# Patient Record
Sex: Female | Born: 1970 | Hispanic: No | Marital: Married | Smoking: Never smoker
Health system: Southern US, Community
[De-identification: ages and names within clinical notes are randomized; demographics above are authoritative.]

---

## 2004-08-31 ENCOUNTER — Other Ambulatory Visit: Admission: RE | Admit: 2004-08-31 | Discharge: 2004-08-31 | Payer: Self-pay | Admitting: Family Medicine

## 2005-08-11 ENCOUNTER — Encounter: Admission: RE | Admit: 2005-08-11 | Discharge: 2005-08-11 | Payer: Self-pay | Admitting: Family Medicine

## 2006-01-30 ENCOUNTER — Encounter: Admission: RE | Admit: 2006-01-30 | Discharge: 2006-01-30 | Payer: Self-pay | Admitting: Family Medicine

## 2006-09-06 ENCOUNTER — Encounter: Admission: RE | Admit: 2006-09-06 | Discharge: 2006-09-06 | Payer: Self-pay | Admitting: Family Medicine

## 2007-03-08 ENCOUNTER — Encounter: Admission: RE | Admit: 2007-03-08 | Discharge: 2007-03-08 | Payer: Self-pay | Admitting: Family Medicine

## 2007-09-30 ENCOUNTER — Encounter: Admission: RE | Admit: 2007-09-30 | Discharge: 2007-09-30 | Payer: Self-pay | Admitting: Family Medicine

## 2014-10-13 ENCOUNTER — Other Ambulatory Visit: Payer: Self-pay

## 2014-10-13 DIAGNOSIS — Z1231 Encounter for screening mammogram for malignant neoplasm of breast: Secondary | ICD-10-CM

## 2014-10-28 ENCOUNTER — Other Ambulatory Visit: Payer: Self-pay

## 2014-10-28 ENCOUNTER — Ambulatory Visit
Admission: RE | Admit: 2014-10-28 | Discharge: 2014-10-28 | Disposition: A | Discharge status: Home / Self Care | Payer: BLUE CROSS/BLUE SHIELD | Source: Ambulatory Visit

## 2014-10-28 DIAGNOSIS — Z1231 Encounter for screening mammogram for malignant neoplasm of breast: Secondary | ICD-10-CM

## 2015-10-08 ENCOUNTER — Encounter: Payer: Self-pay | Admitting: Family Medicine

## 2016-12-25 ENCOUNTER — Other Ambulatory Visit: Payer: Self-pay | Admitting: Internal Medicine

## 2016-12-25 DIAGNOSIS — Z1231 Encounter for screening mammogram for malignant neoplasm of breast: Secondary | ICD-10-CM

## 2017-01-09 ENCOUNTER — Ambulatory Visit (INDEPENDENT_AMBULATORY_CARE_PROVIDER_SITE_OTHER): Payer: 59 | Admitting: Sports Medicine

## 2017-01-09 DIAGNOSIS — M25571 Pain in right ankle and joints of right foot: Secondary | ICD-10-CM

## 2017-01-09 MED ORDER — MELOXICAM 15 MG PO TABS: ORAL_TABLET | ORAL | 3 refills | Status: DC

## 2017-01-09 NOTE — Progress Notes (Signed)
   Subjective:    I'm seeing this patient as a consultation for:  Dr. 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 She has been training to run a 10 K, currently running about 4 miles at a time. Unfortunately has started to develop pain behind her lateral malleolus as well as at the lateral talar dome. Moderate, persistent without radiation, no trauma, no mechanical symptoms.  Past medical history:  Negative.  See flowsheet/record as well for more information.  Surgical history: Negative.  See flowsheet/record as well for more information.  Family history: Negative.  See flowsheet/record as well for more information.  Social history: Negative.  See flowsheet/record as well for more information.  Allergies, and medications have been entered into the medical record, reviewed, and no changes needed.   Review of Systems: No headache, visual changes, nausea, vomiting, diarrhea, constipation, dizziness, abdominal pain, skin rash, fevers, chills, night sweats, weight loss, swollen lymph nodes, body aches, joint swelling, muscle aches, chest pain, shortness of breath, mood changes, visual or auditory hallucinations.   Objective:   General: Well Developed, well nourished, and in no acute distress.  Neuro/Psych: Alert and oriented x3, extra-ocular muscles intact, able to move all 4 extremities, sensation grossly intact. Skin: Warm and dry, no rashes noted.  Respiratory: Not using accessory muscles, speaking in full sentences, trachea midline.  Cardiovascular: Pulses palpable, no extremity edema. Abdomen: Does not appear distended. Right Ankle: No visible erythema or swelling. Range of motion is full in all directions. Strength is 5/5 in all directions. Stable lateral and medial ligaments; squeeze test and kleiger test unremarkable; Tender to palpation over the sinus tarsi anteriorly No pain at base  of 5th MT; No tenderness over cuboid; No tenderness over N spot or navicular prominence No tenderness on posterior aspects of lateral and medial malleolus No sign of peroneal tendon subluxations; Negative tarsal tunnel tinel's Able to walk 4 steps.  Impression and Recommendations:   This case required medical decision making of moderate complexity.  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 Currently running approximately 4 miles at a time.  If insufficient improvement after the first couple of weeks we will drop her down to 2 miles at a time.

## 2017-01-09 NOTE — Assessment & Plan Note (Addendum)
Meloxicam, formal physical therapy, return for custom orthotics. Currently running approximately 4 miles at a time.  If insufficient improvement after the first couple of weeks we will drop her down to 2 miles at a time.

## 2017-01-30 ENCOUNTER — Encounter: Payer: BLUE CROSS/BLUE SHIELD | Admitting: Sports Medicine

## 2017-01-31 ENCOUNTER — Ambulatory Visit: Payer: 59 | Attending: Sports Medicine

## 2017-01-31 DIAGNOSIS — M25571 Pain in right ankle and joints of right foot: Secondary | ICD-10-CM | POA: Diagnosis present

## 2017-01-31 NOTE — Therapy (Signed)
[facility] [address] [place], [place], [address] Phone: [phone]   Fax:  [phone]  Physical Therapy Evaluation  Patient Details  Name: Stephanie Walton MRN: [id number] Date of Birth: 08-30-1971 Referring Provider: Rodney Langtonhekkekandam, Thomas, MD  Encounter Date: 01/31/2017      PT End of Session - 01/31/17 1653    Visit Number 1   Date for PT Re-Evaluation 03/28/17   PT Start Time 1613   PT Stop Time 1654   PT Time Calculation (min) 41 min   Activity Tolerance Patient tolerated treatment well   Behavior [facility] for tasks assessed/performed      History reviewed. No pertinent past medical history.  History reviewed. No pertinent surgical history.  There were no vitals filed for this visit.       Subjective Assessment - 01/31/17 1620    Subjective Pt reports to PT with Rt lateral ankle pain.  Pt has been training for a 10K and started to notice pain.  Pt will get custom orthotics 02/06/17 and will have new shoes.     Pertinent History none   How long can you walk comfortably? no limitations.  Pain with running- pain begins at mile 4 or 6   Patient Stated Goals reduce pain, return to running   Currently in Pain? 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  No   Is the patient reluctant to leave their home because of a fear of falling?  No     Home [employer] manag[employer]vironment   Living Environment Private residence   Living Arrangements Spouse/significant other   Type of Home House   Home Layout Two level     Prior Function   Level of Independence Independent   Vocation Full time employment   Vocation Requirements desk work, lab work   Leisure running, bicycling, swing dancing     Cognition   Overall Cognitive Status Within Functional Limits for tasks assessed     Observation/Other Assessments   Focus on Therapeutic Outcomes (FOTO)  30% limitation     Posture/Postural Control   Posture/Postural Control No significant limitations     ROM / Strength   AROM / PROM / Strength AROM;PROM;Strength     AROM   Overall AROM  Within functional limits for tasks performed   Overall AROM Comments no pain with A/ROM in bil ankles     PROM   Overall PROM  Within functional limits for tasks performed     Strength   Overall Strength Within functional limits for tasks performed   Overall Strength Comments 5/5 Rt ankle strength     Palpation   Palpation comment palpable tenderness over Rt lateral ankle at sinus tarsi.  No edema.       Ambulation/Gait   Ambulation/Gait Yes   Ambulation/Gait Assistance 7: Independent   Ambulation Distance (Feet) 100 Feet   Gait Pattern Within Functional Limits                   [facility] Adult PT Treatment/Exercise - 01/31/17 0001      Modalities   Modalities Iontophoresis     Iontophoresis   Type of Iontophoresis Dexamethasone   Location Rt lateral ankle   Dose 1.0 cc   Time 6 hour wear                PT Education - 01/31/17 1644    Education provided Yes   Education Details ionto, heel raises, single leg stance, gastroc stretch    Person(s) Educated Patient   Methods Explanation;Demonstration;Handout   Comprehension Verbalized understanding;Returned demonstration          PT Short Term Goals - 01/31/17 1657      PT SHORT TERM GOAL #1   Title be indepdnent in initial HEP   Time 4   Period Weeks   Status New     PT SHORT TERM GOAL #2   Title report a 30% reduction in the intensity of R lateral ankle pain with running > 4 miles   Time 4   Period Weeks   Status New           PT Long Term Goals - 01/31/17 1617      PT LONG TERM GOAL #1   Title be independent in advanced HEP   Time 8   Period Weeks   Status New     PT LONG TERM GOAL #2   Title reduce FOTO to < or = to 21% limitation   Time 8   Period Weeks   Status New     PT LONG TERM GOAL #3   Title run without increased pain   Time 8   Period Weeks   Status New               Plan - 01/31/17 1654    Clinical Impression Statement Pt presents to PT with Rt lateral ankle pain that began with training for a 10K race.  Pt has not been running since she saw the MD as she is going to get new shoes and orthotics that will help with alignment.  Pt notices the pain at 4 miles when she is running.  Pt demonstrates full A/ROM and strength.  Pt with palpable tenderness over Rt alteral ankle.  Pt is a low complexity evaluation due to lack of comorbidities.  Pt will benefit from skilled PT for iontophoresis, Rt ankle strength and flexibility.     Rehab Potential Good   PT Frequency 1x / week   PT Duration 8 weeks   PT Treatment/Interventions ADLs/Self Care Home Management;Cryotherapy;Iontophoresis 4mg /ml Dexamethasone;Functional mobility training;Gait training;Ultrasound;Moist Heat;Therapeutic activities;Therapeutic exercise;Balance training;Neuromuscular re-education;Patient/family education;Passive range of motion;Manual techniques;Taping   PT Next Visit Plan Rt ankle proprioception and strength, flexibility, ultrasound, ionto #2   Consulted  and Agree with Plan of Care Patient      Patient will benefit from skilled therapeutic intervention in order to improve  the following deficits and impairments:  Pain, Decreased activity tolerance  Visit Diagnosis: Pain in right ankle and joints of right foot - Plan: PT plan of care cert/re-cert     Problem List Patient Active Problem List   Diagnosis Date Noted  . 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 [place], [place], [address] Phone: [phone]   Fax:  [phone]  Name: Stephanie Walton MRN: [id number] Date of Birth: 1971-06-21

## 2017-01-31 NOTE — Patient Instructions (Addendum)
Gastroc, Sitting (Passive)    Sit with strap or towel around ball of foot. Gently pull toward body. Hold _20__ seconds.  Repeat _3__ times per session. Do _3__ sessions per day.  Copyright  VHI. All rights reserved.   Gastroc / Heel Cord Stretch - On Step    Stand with heels over edge of stair. Holding rail, lower heels until stretch is felt in calf of legs.  Hold 20 seconds. Repeat _3__ times. Do _3__ times per day.  Copyright  VHI. All rights reserved.   Single Leg Balance: Eyes Open    Stand on right leg with eyes open. Hold __10-20_ seconds. _3-5__ reps _2__ times per day.  http://ggbe.exer.us/5   Copyright  VHI. All rights reserved.  Heel Raises      With knees straight, raise heels off ground. Do 2x 10 IONTOPHORESIS PATIENT PRECAUTIONS & CONTRAINDICATIONS:  . Redness under one or both electrodes can occur.  This characterized by a uniform redness that usually disappears within 12 hours of treatment. . Small pinhead size blisters may result in response to the drug.  Contact your physician if the problem persists more than 24 hours. . On rare occasions, iontophoresis therapy can result in temporary skin reactions such as rash, inflammation, irritation or burns.  The skin reactions may be the result of individual sensitivity to the ionic solution used, the condition of the skin at the start of treatment, reaction to the materials in the electrodes, allergies or sensitivity to dexamethasone, or a poor connection between the patch and your skin.  Discontinue using iontophoresis if you have any of these reactions and report to your therapist. . Remove the Patch or electrodes if you have any undue sensation of pain or burning during the treatment and report discomfort to your therapist. . Tell your Therapist if you have had known adverse reactions to the application of electrical current. . If using the Patch, the LED light will turn off when treatment is complete and the  patch can be removed.  Approximate treatment time is 1-3 hours.  Remove the patch when light goes off or after 6 hours. . The Patch can be worn during normal activity, however excessive motion where the electrodes have been placed can cause poor contact between the skin and the electrode or uneven electrical current resulting in greater risk of skin irritation. Marland Kitchen. Keep out of the reach of children.   . DO NOT use if you have a cardiac pacemaker or any other electrically sensitive implanted device. . DO NOT use if you have a known sensitivity to dexamethasone. . DO NOT use during Magnetic Resonance Imaging (MRI). . DO NOT use over broken or compromised skin (e.g. sunburn, cuts, or acne) due to the increased risk of skin reaction. . DO NOT SHAVE over the area to be treated:  To establish good contact between the Patch and the skin, excessive hair may be clipped. . DO NOT place the Patch or electrodes on or over your eyes, directly over your heart, or brain. . DO NOT reuse the Patch or electrodes as this may cause burns to occur.  Copyright  VHI. All rights reserved.  [facility] [address], [address] [place], [place] [address] Phone # [phone] Fax [phone]

## 2017-02-05 ENCOUNTER — Encounter: Payer: Self-pay | Admitting: Radiology

## 2017-02-05 ENCOUNTER — Ambulatory Visit
Admission: RE | Admit: 2017-02-05 | Discharge: 2017-02-05 | Disposition: A | Discharge status: Home / Self Care | Payer: 59 | Source: Ambulatory Visit | Attending: Internal Medicine | Admitting: Internal Medicine

## 2017-02-05 ENCOUNTER — Encounter (INDEPENDENT_AMBULATORY_CARE_PROVIDER_SITE_OTHER): Payer: Self-pay

## 2017-02-05 DIAGNOSIS — Z1231 Encounter for screening mammogram for malignant neoplasm of breast: Secondary | ICD-10-CM

## 2017-02-06 ENCOUNTER — Ambulatory Visit (INDEPENDENT_AMBULATORY_CARE_PROVIDER_SITE_OTHER): Payer: 59 | Admitting: Sports Medicine

## 2017-02-06 DIAGNOSIS — M25571 Pain in right ankle and joints of right foot: Secondary | ICD-10-CM

## 2017-02-06 NOTE — Assessment & Plan Note (Signed)
Custom orthotics as above. Baseline was 4 mile runs, we may drop her down to 2 miles at a time.  Return to see me in one month.

## 2017-02-06 NOTE — Progress Notes (Signed)

## 2017-02-07 ENCOUNTER — Ambulatory Visit: Payer: 59 | Admitting: Physical Therapy

## 2017-02-07 ENCOUNTER — Encounter: Payer: Self-pay | Admitting: Physical Therapy

## 2017-02-07 DIAGNOSIS — M25571 Pain in right ankle and joints of right foot: Secondary | ICD-10-CM

## 2017-02-07 NOTE — Therapy (Signed)
[facility] [address] [place], [place], [address] Phone: [phone]   Fax:  [phone]  Physical Therapy Treatment  Patient Details  Name: Shaena Parkerson MRN: [id number] Date of Birth: [birth year] Referring Provider: Rodney Langton, MD  Encounter Date: 02/07/2017      PT End of Session - 02/07/17 1618    Visit Number 2   Date for PT Re-Evaluation 03/28/17   PT Start Time 1618   PT Stop Time 1703   PT Time Calculation (min) 45 min   Activity Tolerance Patient tolerated treatment well   Behavior During Therapy [facility] for tasks assessed/performed      History reviewed. No pertinent past medical history.  History reviewed. No pertinent surgical history.  There were no vitals filed for this visit.      Subjective Assessment - 02/07/17 1620    Subjective Got new shoes & orthotics will wean into wearing them. Pain has been low to absent. Patch did ok.    Pertinent History none   How long can you walk comfortably? no limitations.  Pain with running- pain begins at mile 4 or 6   Patient Stated Goals reduce pain, return to running   Currently in Pain? Yes   Pain Score 1    Pain Location Ankle   Pain Orientation Lateral   Pain Descriptors / Indicators Aching   Aggravating Factors  running   Pain Relieving Factors rest   Multiple Pain Sites No                         [facility] Adult PT Treatment/Exercise - 02/07/17 0001      Self-Care   Self-Care --  Role of ice vs heat, compensatory positions of foot/ankle an     Ultrasound   Ultrasound Location RT lateral ankle   Ultrasound Parameters 3 MZ 100% 1wtcm2 x 10 min   Ultrasound Goals Pain     Iontophoresis   Type of Iontophoresis Dexamethasone   Location Rt lateral ankle   Dose 1 ml   Time 6 hour wear  #2 skin intact                PT Education - 02/07/17 1635    Education provided Yes   Education Details Standing single  leg balance working on a more balanced weight distribution on level surface   Person(s) Educated Patient   Methods Explanation;Demonstration;Tactile cues;Verbal cues   Comprehension Verbalized understanding;Returned demonstration          PT Short Term Goals - 02/07/17 1651      PT SHORT TERM GOAL #1   Title be indepdnent in initial HEP   Time 4   Period Weeks   Status Achieved     PT SHORT TERM GOAL #2   Title report a 30% reduction in the intensity of R lateral ankle pain with running > 4 miles   Time 4   Period Weeks   Status On-going  Has not been running much bc she was waiting on her new shoes           PT Long Term Goals - 01/31/17 1617      PT LONG TERM GOAL #1   Title be independent in advanced HEP   Time 8   Period Weeks   Status New     PT LONG TERM GOAL #2   Title reduce FOTO to < or = to 21% limitation  Time 8   Period Weeks   Status New     PT LONG TERM GOAL #3   Title run without increased pain   Time 8   Period Weeks   Status New               Plan - 02/07/17 1619    Clinical Impression Statement Pt has not done much running as she was waiting on her new shoes and orthotics. She got them yesterday and plans to do a short run this Friday if the weather cooperates. She presented today with little pain at the lateral ankle ATF ligament area. Modalities were used to decrease tenderness. Pt is independent and compliant with her initial HEP. Just first treatment so too soon for goals.     Rehab Potential Good   PT Frequency 1x / week   PT Duration 8 weeks   PT Treatment/Interventions ADLs/Self Care Home Management;Cryotherapy;Iontophoresis 4mg /ml Dexamethasone;Functional mobility training;Gait training;Ultrasound;Moist Heat;Therapeutic activities;Therapeutic exercise;Balance training;Neuromuscular re-education;Patient/family education;Passive range of motion;Manual techniques;Taping   PT Next Visit Plan US if helpful, ionto #3, standing ankle  stabiization exs.   Consulted and Agree with Plan of Care Patient      Patient will benefit from skilled therapeutic intervention in order to improve the following deficits and impairments:  Pain, Decreased activity tolerance, Impaired flexibility  Visit Diagnosis: Pain in right ankle and joints of right foot     Problem List Patient Active Problem List   Diagnosis Date Noted  . 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 [place], [place], [address] Phone: [phone]   Fax:  [phone]  Name: Bennie HindGail Cieslak MRN: [id number] Date of Birth: [birth year]

## 2017-02-13 ENCOUNTER — Ambulatory Visit: Payer: 59 | Admitting: Physical Therapy

## 2017-02-13 DIAGNOSIS — M25571 Pain in right ankle and joints of right foot: Secondary | ICD-10-CM

## 2017-02-13 NOTE — Therapy (Signed)
[facility] [address] [place], [place], [address] Phone: [phone]   Fax:  [phone]  Physical Therapy Treatment  Patient Details  Name: Stephanie Walton MRN: [id number] Date of Birth: [birth year] Referring Provider: Rodney Langton, MD  Encounter Date: 02/13/2017      PT End of Session - 02/13/17 1617    Visit Number 3   Date for PT Re-Evaluation 03/28/17   PT Start Time 1617   PT Stop Time 1702   PT Time Calculation (min) 45 min   Activity Tolerance Patient tolerated treatment well   Behavior During Therapy [facility] for tasks assessed/performed      No past medical history on file.  No past surgical history on file.  There were no vitals filed for this visit.      Subjective Assessment - 02/13/17 1620    Subjective I have been doing stretches and balance.  I wasn't having pain that I can reember when I came in.  I am having more pain around the peroneal tendon.   Pertinent History none   How long can you walk comfortably? no limitations.  Pain with running- pain begins at mile 4 or 6   Patient Stated Goals reduce pain, return to running   Currently in Pain? No/denies                         [facility] Adult PT Treatment/Exercise - 02/13/17 0001      Neuro Re-ed    Neuro Re-ed Details  arch lifting in various positions, glute med activation     Iontophoresis   Type of Iontophoresis Dexamethasone   Location Rt lateral ankle   Dose 1 ml   Time 6 hour wear  #3 skin intact     Ankle Exercises: Supine   T-Band inversion, eversion - yellow band - 20x     Ankle Exercises: Standing   BAPS Sitting;Level 2  20x both ways   Other Standing Ankle Exercises heel raises - 20x     Ankle Exercises: Sidelying   Other Sidelying Ankle Exercises clamshell - 20x     Ankle Exercises: Stretches   Gastroc Stretch 30 seconds;3 reps  towel, inversion/eversion variations                PT  Education - 02/13/17 1655    Education provided Yes   Education Details glute med hip hike, arch lifting, clamshells, ankle inversion, eversion   Person(s) Educated Patient   Methods Explanation;Demonstration;Tactile cues;Verbal cues;Handout   Comprehension Verbalized understanding;Returned demonstration          PT Short Term Goals - 02/13/17 1718      PT SHORT TERM GOAL #2   Title report a 30% reduction in the intensity of R lateral ankle pain with running > 4 miles   Time 4   Period Weeks   Status On-going           PT Long Term Goals - 02/13/17 1718      PT LONG TERM GOAL #1   Title be independent in advanced HEP   Time 8   Period Weeks   Status On-going     PT LONG TERM GOAL #2   Title reduce FOTO to < or = to 21% limitation   Time 8   Period Weeks   Status On-going     PT LONG TERM GOAL #3   Title run without increased pain   Time  8   Period Weeks   Status On-going               Plan - 02/13/17 1705    Clinical Impression Statement Patient demonstrates improved arch with cues for lifting.  She demonstrates good understanding of exercises given for HEP.  She continues to progress in strength and will benefit from skilled PT to improved muscle imbalances in order to assist her in returning to running with reduced risk of injury.   Rehab Potential Good   PT Treatment/Interventions ADLs/Self Care Home Management;Cryotherapy;Iontophoresis 4mg /ml Dexamethasone;Functional mobility training;Gait training;Ultrasound;Moist Heat;Therapeutic activities;Therapeutic exercise;Balance training;Neuromuscular re-education;Patient/family education;Passive range of motion;Manual techniques;Taping   PT Next Visit Plan progress single leg exercise with arch lift   Consulted and Agree with Plan of Care Patient      Patient will benefit from skilled therapeutic intervention in order to improve the following deficits and impairments:  Pain, Decreased activity tolerance,  Impaired flexibility  Visit Diagnosis: Pain in right ankle and joints of right foot     Problem List Patient Active Problem List   Diagnosis Date Noted  . 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 [place], [place], [address] Phone: [phone]   Fax:  [phone]  Name: Stephanie HindGail Walton MRN: [id number] Date of Birth: Apr 09, 1971

## 2017-02-13 NOTE — Patient Instructions (Signed)
   Lift arch by lifting toes, make sure heel and balls of toes in planted into the floor Rotate hip out like you are spiraling the leg out to activate the lateral hip muscles    20x   20x   CLAM SHELLS  While lying on your side with your knees bent, draw up the top knee while keeping contact of your feet together.  Do not let your pelvis roll back during the lifting movement.  20x    Hip Hikes  Start: Position yourself on a stool with one leg on and one off of the stool  Movement: Hike the hip that is not on the stool up and then lower it down.  This should fatigue the weightbearing side and strengthen the glute medius on that side.

## 2017-02-21 ENCOUNTER — Encounter: Payer: Self-pay | Admitting: Physical Therapy

## 2017-02-21 ENCOUNTER — Ambulatory Visit: Payer: 59 | Admitting: Physical Therapy

## 2017-02-21 DIAGNOSIS — M25571 Pain in right ankle and joints of right foot: Secondary | ICD-10-CM

## 2017-02-21 NOTE — Therapy (Signed)
[facility] [address], [address] [place], [place], [address] Phone: [phone]   Fax:  [phone]  Physical Therapy Treatment  Patient Details  Name: Stephanie Walton MRN: [id number] Date of Birth: May 15, 1971 Referring Provider: Rodney Langton, MD  Encounter Date: 02/21/2017      PT End of Session - 02/21/17 1616    Visit Number 4   Date for PT Re-Evaluation 03/28/17   PT Start Time 1616   PT Stop Time 1700   PT Time Calculation (min) 44 min   Activity Tolerance Patient tolerated treatment well   Behavior During Therapy [facility] for tasks assessed/performed      History reviewed. No pertinent past medical history.  History reviewed. No pertinent surgical history.  There were no vitals filed for this visit.      Subjective Assessment - 02/21/17 1618    Subjective Things improving. Wearing orthotics and they are doing well. Not much running but alot of walking and this is also improving.    Currently in Pain? Yes   Pain Score 1    Pain Location Ankle   Pain Orientation Right;Lateral   Pain Descriptors / Indicators Dull   Aggravating Factors  constantly   Pain Relieving Factors rest, ionto   Multiple Pain Sites No                         [facility] Adult PT Treatment/Exercise - 02/21/17 0001      Iontophoresis   Type of Iontophoresis Dexamethasone   Location Rt lateral ankle   Dose 1 ml   Time 6 hour wear  #4 skin intact     Manual Therapy   Manual Therapy Myofascial release  and friction massage to anteriolateral malleolus     Ankle Exercises: Seated   BAPS Sitting;Level 3  20x each direction   Other Seated Ankle Exercises INv/Ev with towel 10x each     Ankle Exercises: Sidelying   Other Sidelying Ankle Exercises clamshell yellow 2x10  emphasizing side body length     Ankle Exercises: Stretches   Gastroc Stretch 3 reps;30 seconds  on slant board     Ankle Exercises: Standing   SLS  On blue balance pod 20 sec with arch lift 4x                 PT Education - 02/21/17 1644    Education provided Yes   Education Details Gave yellow band for home clamshells   Person(s) Educated Patient   Methods Explanation;Demonstration;Tactile cues;Verbal cues   Comprehension Verbalized understanding;Returned demonstration          PT Short Term Goals - 02/21/17 1624      PT SHORT TERM GOAL #2   Title report a 30% reduction in the intensity of R lateral ankle pain with running > 4 miles   Time 4   Period Weeks   Status On-going  Pt has not done much running secondary to travel & weather.            PT Long Term Goals - 02/13/17 1718      PT LONG TERM GOAL #1   Title be independent in advanced HEP   Time 8   Period Weeks   Status On-going     PT LONG TERM GOAL #2   Title reduce FOTO to < or = to 21% limitation   Time 8   Period Weeks   Status On-going  PT LONG TERM GOAL #3   Title run without increased pain   Time 8   Period Weeks   Status On-going               Plan - 02/21/17 1623    Clinical Impression Statement Pt has not been to run much since last session due to travel and weather. Pt has done alot of walking and has had very minimal soreness to the lateral malleolus. Pt feels she is 50% better than on eval.    Rehab Potential Good   PT Frequency 1x / week   PT Duration 8 weeks   PT Treatment/Interventions ADLs/Self Care Home Management;Cryotherapy;Iontophoresis 4mg /ml Dexamethasone;Functional mobility training;Gait training;Ultrasound;Moist Heat;Therapeutic activities;Therapeutic exercise;Balance training;Neuromuscular re-education;Patient/family education;Passive range of motion;Manual techniques;Taping   PT Next Visit Plan strengthen ankle, ionto #5, see how friction massage did with the pain.    Consulted and Agree with Plan of Care Patient      Patient will benefit from skilled therapeutic intervention in order to improve the  following deficits and impairments:  Pain, Decreased activity tolerance, Impaired flexibility  Visit Diagnosis: Pain in right ankle and joints of right foot     Problem List Patient Active Problem List   Diagnosis Date Noted  . 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 [place] [address], [address] [place], [place], [address] Phone: [phone]   Fax:  [phone]  Name: Bennie HindGail Lienhard MRN: [id number] Date of Birth: 06-30-1971

## 2017-02-28 ENCOUNTER — Ambulatory Visit: Payer: 59 | Attending: Sports Medicine

## 2017-02-28 DIAGNOSIS — M25571 Pain in right ankle and joints of right foot: Secondary | ICD-10-CM | POA: Insufficient documentation

## 2017-02-28 NOTE — Therapy (Signed)
[facility] [address], [address] [place], [place], [address] Phone: [phone]   Fax:  [phone]  Physical Therapy Treatment  Patient Details  Name: Stephanie Walton MRN: [id number] Date of Birth: [birth year] Referring Provider: Rodney Langton, MD  Encounter Date: 02/28/2017      PT End of Session - 02/28/17 1715    Visit Number 5   Date for PT Re-Evaluation 03/28/17   PT Start Time 1619   PT Stop Time 1710   PT Time Calculation (min) 51 min   Activity Tolerance Patient tolerated treatment well   Behavior During Therapy [facility] for tasks assessed/performed      No past medical history on file.  No past surgical history on file.  There were no vitals filed for this visit.      Subjective Assessment - 02/28/17 1621    Subjective I got to kind of run once since I was here last but I really didn't give it a good try because I was on my phone some. So my pain was only like a 1 but I could tell it definitely didn't feel strong enough. I have been cycling again though and that's been fine.    Currently in Pain? Yes   Pain Score 1    Pain Location Ankle   Pain Orientation Left;Lateral   Pain Descriptors / Indicators Dull   Pain Type Acute pain   Pain Onset More than a month ago   Pain Frequency Intermittent   Aggravating Factors  just still feels weak   Pain Relieving Factors rest                         [facility] Adult PT Treatment/Exercise - 02/28/17 0001      Knee/Hip Exercises: Standing   Heel Raises Both;10 reps  On edge of step with calf stretch    Forward Step Up Right;10 reps;Step Height: 6"  With green oval on step   SLS Rt SLS with hip 4 way with red theraband for Lt LE (required fingertip support for adduction and flexion)   Other Standing Knee Exercises Rt SLS with Lt LE on mat behind her for squat 10 times.     Iontophoresis   Type of Iontophoresis Dexamethasone   Location Rt lateral  ankle   Dose 1 ml   Time 6 hour wear  #5, skin intact     Ankle Exercises: Aerobic   Stationary Bike Level 5 x 7 minutes      Ankle Exercises: Seated   BAPS Sitting;Level 3  20x each direction     Ankle Exercises: Standing   Other Standing Ankle Exercises Resisted walking 30# 4 way walking 5 times each with SBA-CGA each way                PT Education - 02/28/17 1715    Education provided Yes   Education Details Gave red theraband for SLS    Person(s) Educated Patient   Methods Explanation;Demonstration;Handout   Comprehension Verbalized understanding;Returned demonstration          PT Short Term Goals - 02/21/17 1624      PT SHORT TERM GOAL #2   Title report a 30% reduction in the intensity of R lateral ankle pain with running > 4 miles   Time 4   Period Weeks   Status On-going  Pt has not done much running secondary to travel & weather.  PT Long Term Goals - 02/13/17 1718      PT LONG TERM GOAL #1   Title be independent in advanced HEP   Time 8   Period Weeks   Status On-going     PT LONG TERM GOAL #2   Title reduce FOTO to < or = to 21% limitation   Time 8   Period Weeks   Status On-going     PT LONG TERM GOAL #3   Title run without increased pain   Time 8   Period Weeks   Status On-going               Plan - 02/28/17 1716    Clinical Impression Statement Pt has only been able to run once but reports she wasn't able to run like she wanted due to other factors so plans to try this a few times before next visit in 2 weeks. Was able to progress pt today to include more standing proprioceptive exercises and pt reported noting benfit and challenge of all. Also included some of these in HEP (see education section).    Rehab Potential Good   PT Frequency 1x / week   PT Duration 8 weeks   PT Treatment/Interventions ADLs/Self Care Home Management;Cryotherapy;Iontophoresis 4mg /ml Dexamethasone;Functional mobility training;Gait  training;Ultrasound;Moist Heat;Therapeutic activities;Therapeutic exercise;Balance training;Neuromuscular re-education;Patient/family education;Passive range of motion;Manual techniques;Taping   PT Next Visit Plan strengthen ankle, ionto #6, cont progressing pt with standing/proprioceptive activities and assess if pt was able to run. Probable D/C next visit as pt is progressing well and reports she will feel ready for this next visit if she is able to run.    Consulted and Agree with Plan of Care Patient      Patient will benefit from skilled therapeutic intervention in order to improve the following deficits and impairments:  Pain, Decreased activity tolerance, Impaired flexibility  Visit Diagnosis: Pain in right ankle and joints of right foot     Problem List Patient Active Problem List   Diagnosis Date Noted  . 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 [place], [place], [address] Phone: [phone]   Fax:  [phone]  Name: Stephanie Walton MRN: [id number] Date of Birth: July 09, 1971

## 2017-02-28 NOTE — Patient Instructions (Signed)
ABDUCTION: Standing - Resistance Band (Active)   Stand, feet flat. Against yellow resistance band, lift right leg out to side. Complete _10__ repetitions. Perform _1-2__ sessions per day.  ADDUCTION: Standing - Stable: Resistance Band (Active)   Stand, right leg out to side as far as possible. Against yellow resistance band, draw leg in across midline. Complete  _10__ repetitions. Perform _1-2__ sessions per day.  Strengthening: Hip Flexion - Resisted   With tubing around left ankle, anchor behind, bring leg forward, keeping knee straight. Repeat __10__ times per set.  Do _1-2___ sessions per day.  Strengthening: Hip Extension - Resisted   With tubing around right ankle, face anchor and pull leg straight back. Repeat _10___ times per set. Do _1-2___ sessions per day.

## 2017-03-06 ENCOUNTER — Ambulatory Visit (INDEPENDENT_AMBULATORY_CARE_PROVIDER_SITE_OTHER): Payer: 59 | Admitting: Sports Medicine

## 2017-03-06 DIAGNOSIS — M25571 Pain in right ankle and joints of right foot: Secondary | ICD-10-CM

## 2017-03-06 NOTE — Assessment & Plan Note (Addendum)
Essentially resolved with custom orthotics and formal physical therapy.  Was able to get back into dancing without any pain. She is going to try to get back into running as well. She is going to return for a new set of custom orthotics

## 2017-03-06 NOTE — Progress Notes (Signed)
  Subjective:    CC:  Follow-up  HPI: This is a pleasant 46 year old female, we diagnosed with sinus tarsi syndrome and right peroneal tinnitus at the first visit, she did well with custom orthotics, physical therapy and returns today nearly pain-free.  Past medical history:  Negative.  See flowsheet/record as well for more information.  Surgical history: Negative.  See flowsheet/record as well for more information.  Family history: Negative.  See flowsheet/record as well for more information.  Social history: Negative.  See flowsheet/record as well for more information.  Allergies, and medications have been entered into the medical record, reviewed, and no changes needed.   Review of Systems: No fevers, chills, night sweats, weight loss, chest pain, or shortness of breath.   Objective:    General: Well Developed, well nourished, and in no acute distress.  Neuro: Alert and oriented x3, extra-ocular muscles intact, sensation grossly intact.  HEENT: Normocephalic, atraumatic, pupils equal round reactive to light, neck supple, no masses, no lymphadenopathy, thyroid nonpalpable.  Skin: Warm and dry, no rashes. Cardiac: Regular rate and rhythm, no murmurs rubs or gallops, no lower extremity edema.  Respiratory: Clear to auscultation bilaterally. Not using accessory muscles, speaking in full sentences. Right Ankle: No visible erythema or swelling. Range of motion is full in all directions. Strength is 5/5 in all directions. Stable lateral and medial ligaments; squeeze test and kleiger test unremarkable; Talar dome nontender; No pain at base of 5th MT; No tenderness over cuboid; No tenderness over N spot or navicular prominence No tenderness on posterior aspects of lateral and medial malleolus No sign of peroneal tendon subluxations; Negative tarsal tunnel tinel's Able to walk 4 steps.  Impression and Recommendations:    Sinus tarsi syndrome and peroneal tendinitis of right  ankle Essentially resolved with custom orthotics and formal physical therapy.  Was able to get back into dancing without any pain. She is going to try to get back into running as well. She is going to return for a new set of custom orthotics

## 2017-03-14 ENCOUNTER — Ambulatory Visit: Payer: 59 | Admitting: Physical Therapy

## 2017-03-14 ENCOUNTER — Encounter: Payer: Self-pay | Admitting: Physical Therapy

## 2017-03-14 DIAGNOSIS — M25571 Pain in right ankle and joints of right foot: Secondary | ICD-10-CM

## 2017-03-14 NOTE — Therapy (Signed)
[facility] [address], [address] [place], [place], [address] Phone: [phone]   Fax:  [phone]  Physical Therapy Treatment  Patient Details  Name: Bennie HindGail Lakatos MRN: [id number] Date of Birth: May 14, 1971 Referring Provider: Rodney Langtonhekkekandam, Thomas, MD  Encounter Date: 03/14/2017      PT End of Session - 03/14/17 1619    Visit Number 6   Date for PT Re-Evaluation 03/28/17   PT Start Time 1619   PT Stop Time 1657   PT Time Calculation (min) 38 min   Activity Tolerance Patient tolerated treatment well   Behavior During Therapy [facility] for tasks assessed/performed      History reviewed. No pertinent past medical history.  History reviewed. No pertinent surgical history.  There were no vitals filed for this visit.      Subjective Assessment - 03/14/17 1620    Subjective Saw MD and he was pleased. She will get another pain of orthotics. I am 90% improved since eval. Walked on the beach in flip flops this weekend and did great.    Pertinent History none   How long can you walk comfortably? no limitations.  Pain with running- pain begins at mile 4 or 6   Patient Stated Goals reduce pain, return to running   Currently in Pain? No/denies   Multiple Pain Sites No                         [facility] Adult PT Treatment/Exercise - 03/14/17 0001      Iontophoresis   Type of Iontophoresis Dexamethasone   Location Rt lateral ankle   Dose 1ml  skin intact   Time 6 hour wear  #6, skin intact     Ankle Exercises: Seated   BAPS Sitting;Level 3  20x each direction     Ankle Exercises: Standing   SLS On blue balance pod 20 sec with arch lift 4x    Other Standing Ankle Exercises Resisted walking 30# 4 way walking 5 times each with SBA-CGA each way     Ankle Exercises: Stretches   Gastroc Stretch 3 reps;30 seconds  on slant board                  PT Short Term Goals - 02/21/17 1624      PT SHORT TERM  GOAL #2   Title report a 30% reduction in the intensity of R lateral ankle pain with running > 4 miles   Time 4   Period Weeks   Status On-going  Pt has not done much running secondary to travel & weather.            PT Long Term Goals - 02/13/17 1718      PT LONG TERM GOAL #1   Title be independent in advanced HEP   Time 8   Period Weeks   Status On-going     PT LONG TERM GOAL #2   Title reduce FOTO to < or = to 21% limitation   Time 8   Period Weeks   Status On-going     PT LONG TERM GOAL #3   Title run without increased pain   Time 8   Period Weeks   Status On-going               Plan - 03/14/17 1628    Clinical Impression Statement Pt reports she is 90% improved at this time. She still has not run any length  close to 4 miles, which is her goal. No issue/pain with any exercises. Today was the final ionto patch.     Rehab Potential Good   PT Frequency 1x / week   PT Duration 8 weeks   PT Treatment/Interventions ADLs/Self Care Home Management;Cryotherapy;Iontophoresis 4mg /ml Dexamethasone;Functional mobility training;Gait training;Ultrasound;Moist Heat;Therapeutic activities;Therapeutic exercise;Balance training;Neuromuscular re-education;Patient/family education;Passive range of motion;Manual techniques;Taping   PT Next Visit Plan Anticpiate DC next visit, FOTO.    Consulted and Agree with Plan of Care Patient      Patient will benefit from skilled therapeutic intervention in order to improve the following deficits and impairments:  Pain, Decreased activity tolerance, Impaired flexibility  Visit Diagnosis: Pain in right ankle and joints of right foot     Problem List Patient Active Problem List   Diagnosis Date Noted  . 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 [place], [place],  [address] Phone: [phone]   Fax:  [phone]  Name: Dondrea Clendenin MRN: [id number] Date of Birth: 02-Mar-1971

## 2017-03-21 ENCOUNTER — Ambulatory Visit: Payer: 59

## 2017-03-21 DIAGNOSIS — M25571 Pain in right ankle and joints of right foot: Secondary | ICD-10-CM

## 2017-03-21 NOTE — Therapy (Signed)
[facility] [address] [place], [place], [address] Phone: [phone]   Fax:  [phone]  Physical Therapy Treatment  Patient Details  Name: Stephanie Walton MRN: [id number] Date of Birth: [birth year] Referring Provider: Aundria Mems, MD  Encounter Date: 03/21/2017      PT End of Session - 03/21/17 1646    Visit Number 7   Date for PT Re-Evaluation 03/28/17   PT Start Time 0045   PT Stop Time 9977  last session, no pain   PT Time Calculation (min) 33 min   Activity Tolerance Patient tolerated treatment well   Behavior During Therapy [facility] for tasks assessed/performed      History reviewed. No pertinent past medical history.  History reviewed. No pertinent surgical history.  There were no vitals filed for this visit.      Subjective Assessment - 03/21/17 1619    Subjective 90% overall improvement in symptoms since the start of care.  I have been very active.  Stand up paddle boarding helps to strengthen my foot intrinsics.     Currently in Pain? No/denies            [facility] PT Assessment - 03/21/17 0001      Assessment   Medical Diagnosis Sinus tarsi syndrome of the Rt ankle     Cognition   Overall Cognitive Status Within Functional Limits for tasks assessed     Observation/Other Assessments   Focus on Therapeutic Outcomes (FOTO)  27% limitation     Posture/Postural Control   Posture/Postural Control No significant limitations                     [facility] Adult PT Treatment/Exercise - 03/21/17 0001      Ankle Exercises: Seated   BAPS Sitting;Level 3  20x each direction- 4 ways     Ankle Exercises: Standing   SLS On blue balance pod 20 sec with arch lift 4x    Other Standing Ankle Exercises Resisted walking 30# 4 way walking 5 times each with SBA-CGA each way                  PT Short Term Goals - 02/21/17 1624      PT SHORT TERM GOAL #2   Title report a 30%  reduction in the intensity of R lateral ankle pain with running > 4 miles   Time 4   Period Weeks   Status On-going  Pt has not done much running secondary to travel & weather.            PT Long Term Goals - 03/21/17 1621      PT LONG TERM GOAL #1   Title be independent in advanced HEP   Status Achieved     PT LONG TERM GOAL #2   Title reduce FOTO to < or = to 21% limitation   Baseline 27% limitation   Status On-going     PT LONG TERM GOAL #3   Title run without increased pain   Baseline 1 mile without pain   Status Achieved               Plan - 03/21/17 1625    Clinical Impression Statement Pt reports 90% overall improvement.  Pt has been running 1 mile without increased pain.  Pt will plan to gradually increase her distance and get fitted for new orthotics.  Pt will D/C to HEP for continued gains.  PT Next Visit Plan D/C to HEP   Consulted and Agree with Plan of Care Patient      Patient will benefit from skilled therapeutic intervention in order to improve the following deficits and impairments:     Visit Diagnosis: Pain in right ankle and joints of right foot     Problem List Patient Active Problem List   Diagnosis Date Noted  . 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  Pt is running 1 mile with goal of 4 miles.     Remaining deficits: See above for current status.     Education / Equipment: HEP Plan: Patient agrees to discharge.  Patient goals were partially met. Patient is being discharged due to being pleased with the current functional level.  ?????         Sigurd Sos, PT 03/21/17 4:51 PM  [facility] [address] [place], [place], [address] Phone: [phone]   Fax:   [phone]  Name: Stephanie Walton MRN: [id number] Date of Birth: November 21, 1970

## 2017-03-26 ENCOUNTER — Ambulatory Visit: Payer: 59 | Admitting: Physical Therapy

## 2017-04-10 ENCOUNTER — Encounter: Payer: 59 | Admitting: Sports Medicine

## 2017-05-08 ENCOUNTER — Ambulatory Visit (INDEPENDENT_AMBULATORY_CARE_PROVIDER_SITE_OTHER): Payer: 59 | Admitting: Sports Medicine

## 2017-05-08 DIAGNOSIS — M25571 Pain in right ankle and joints of right foot: Secondary | ICD-10-CM | POA: Diagnosis not present

## 2017-05-08 NOTE — Assessment & Plan Note (Signed)
Did well with custom orthotics, physical therapy. She is here for her second set of custom orthotics.

## 2017-05-08 NOTE — Progress Notes (Signed)

## 2017-09-27 ENCOUNTER — Encounter: Payer: Self-pay | Admitting: Family Medicine

## 2017-10-08 ENCOUNTER — Encounter: Payer: Self-pay | Admitting: Family Medicine

## 2017-10-08 LAB — IFOBT (OCCULT BLOOD): IFOBT: NEGATIVE

## 2018-08-08 ENCOUNTER — Telehealth (INDEPENDENT_AMBULATORY_CARE_PROVIDER_SITE_OTHER): Payer: Self-pay | Admitting: Family Medicine

## 2018-08-08 NOTE — Telephone Encounter (Signed)
Patient called inquiring if we [facility]. I advised her they were received and scanned into her chart.

## 2018-09-06 ENCOUNTER — Ambulatory Visit (INDEPENDENT_AMBULATORY_CARE_PROVIDER_SITE_OTHER): Payer: 59 | Admitting: Internal Medicine

## 2018-09-06 DIAGNOSIS — Z9189 Other specified personal risk factors, not elsewhere classified: Secondary | ICD-10-CM

## 2018-09-06 DIAGNOSIS — Z298 Encounter for other specified prophylactic measures: Secondary | ICD-10-CM

## 2018-09-06 DIAGNOSIS — Z789 Other specified health status: Secondary | ICD-10-CM

## 2018-09-06 DIAGNOSIS — Z7184 Encounter for health counseling related to travel: Secondary | ICD-10-CM

## 2018-09-06 MED ORDER — OSELTAMIVIR PHOSPHATE 75 MG PO CAPS: 75.0000 mg | ORAL_CAPSULE | Freq: Two times a day (BID) | ORAL | 0 refills | Status: DC

## 2018-09-06 MED ORDER — ATOVAQUONE-PROGUANIL HCL 250-100 MG PO TABS: 1.0000 | ORAL_TABLET | Freq: Every day | ORAL | 0 refills | Status: DC

## 2018-09-06 NOTE — Progress Notes (Signed)
Subjective:   Stephanie Walton is a 47 y.o. female who presents to the Infectious Disease clinic for travel consultation. Planned departure date: jan 16th Planned return date: Jan 26th Countries of travel: [place] -- traveling throug [place]-staying in airport - planning on [place] trip Areas in country: rural Accommodations: hotel Purpose of travel: vacation Prior travel out of [place]: yes- [place], [place], [place] 2017, [place], [place], [place], [place] in 2012, [place], [place] in 2005  uptodate of travel vaccine; has not had seasonal flu vaccine yet; had rabies vaccine in 2005 during [place] vet trip     Objective:   Medications:vitamin b12    Assessment:   No contraindications to travel.     Plan:    pre travel vaccination = recommended to get travel vaccine  Traveler's diarrhea = gave instructions how to use azithromycin ( has abtx in hand) to use if needed  Flu risk = traveling during peak season and gave oseltamivir to use if needed. Gave instruction to when/if to use  Mosquito bite prevention/malaria proph = they maybe going to [place] for day excursion. Gave rx malarone for the trip.

## 2018-09-06 NOTE — Patient Instructions (Signed)
Please start taking malarone on jan 18th and take daily on full stomach until complete  Please get your flu vaccine prior to leaving on trip  If you have 3+ loose stools/24hr. And not responding to pepto-bismal. Can take azithromycin 500mg  daily. Can stop if diarrhea resolves. If no response after 5 days of antibiotic, please stop abtx and see healthcare provider   If you have flu like illness- fever, chills, body -muscle aches, headache, cough, can take tamiflu 75mg  1 tab twice a day x 5 days.

## 2018-10-08 ENCOUNTER — Encounter (INDEPENDENT_AMBULATORY_CARE_PROVIDER_SITE_OTHER): Payer: Self-pay | Admitting: Family Medicine

## 2018-10-22 ENCOUNTER — Encounter (INDEPENDENT_AMBULATORY_CARE_PROVIDER_SITE_OTHER): Payer: Self-pay | Admitting: Family Medicine

## 2018-10-22 ENCOUNTER — Ambulatory Visit (INDEPENDENT_AMBULATORY_CARE_PROVIDER_SITE_OTHER): Payer: 59 | Admitting: Family Medicine

## 2018-10-22 VITALS — BP 136/82 | HR 55 | Temp 98.6°F | Ht 66.75 in | Wt 134.5 lb

## 2018-10-22 DIAGNOSIS — R7989 Other specified abnormal findings of blood chemistry: Secondary | ICD-10-CM

## 2018-10-22 DIAGNOSIS — Z0000 Encounter for general adult medical examination without abnormal findings: Secondary | ICD-10-CM

## 2018-10-22 DIAGNOSIS — L853 Xerosis cutis: Secondary | ICD-10-CM | POA: Diagnosis not present

## 2018-10-22 NOTE — Progress Notes (Signed)
Office Visit Note   Patient: Stephanie Walton           Date of Birth: 02/09/1971           MRN: [id number] Visit Date: 10/22/2018 Requested by: Gaspar Garbeisovec, Richard W, MD [address] [place], [place] [address] PCP: Lavada MesiHilts, Shonika Kolasinski, MD  Subjective: Chief Complaint  Patient presents with  . Annual Exam    HPI: She is a 48 year old here to reestablish care.  She is here for her annual wellness exam.  Overall she has been in excellent health.  Over the years she has had slightly elevated TSH readings but few symptoms of hypothyroidism.  Upon questioning, she states that her skin is always dry and she "likes to sleep", and is able to take naps just about anytime she wants to.  No history of hair loss, constipation or diarrhea, unintentional weight change.  No family history of thyroid dysfunction.  She has had a couple episodes of low back pain treated with anti-inflammatory medication.  She is feeling well right now, but she is always concerned that her back might "give out" again.  Her last mammogram was 2018.  She wants to wait until next year to have another one.  Her last Pap was 2013 and they all have been normal.  She has not yet had a colonoscopy or a bone density test.  Immunizations are up-to-date.  She has annual eye exams and dental exams every 6 months.  She had a dermatology exam about a year ago.                ROS: All other systems were reviewed and are negative.  Objective: Vital Signs: BP 136/82 (BP Location: Left Arm, Patient Position: Sitting, Cuff Size: Normal)   Pulse (!) 55   Temp 98.6 F (37 C)   Ht 5' 6.75" (1.695 m)   Wt 134 lb 8 oz (61 kg)   BMI 21.22 kg/m   Physical Exam:  HEENT:  [place]/AT, PERRLA, EOM Full, no nystagmus.  Funduscopic examination within normal limits.  No conjunctival erythema.  Tympanic membranes are pearly gray with normal landmarks.  External ear canals are normal.  Nasal passages are clear.  Oropharynx is clear.  No significant  lymphadenopathy.  No thyromegaly or nodules.  2+ carotid pulses without bruits.  CV: Regular rate and rhythm without murmurs, rubs, or gallops.  No peripheral edema.  2+ radial and posterior tibial pulses.  Lungs: Clear to auscultation throughout with no wheezing or areas of consolidation.  Abd: Bowel sounds are active, no hepatosplenomegaly or masses.  Soft and nontender.    Neuro: 2+ upper and lower extremity DTRs.  Skin: Dry skin on the hands but no skin lesions seen.  Breast and pelvic deferred.   Imaging: None today.  Assessment & Plan: 1.  Wellness exam -Healthy female.  She wants to wait 1 year before doing labs again. -Pap smear, mammogram at next wellness exam.  2.  Elevated TSH -She wants to try some over-the-counter nutritional supplements and check labs again in 1 year.  3.  Intermittent low back pain -Physical therapy if symptoms recur.   Follow-Up Instructions: Return in about 1 year (around 10/23/2019).      Procedures: No procedures performed  No notes on file    PMFS History: Patient Active Problem List   Diagnosis Date Noted  . Elevated TSH 10/22/2018  . 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 No pertinent  past medical history.  Family History  Problem Relation Age of Onset  . Diabetes Father        Prediabetes  . Prostate cancer Father   . Cancer Father   . Heart disease Paternal Grandmother   . Prostate cancer Paternal Grandfather   . Cancer Paternal Grandfather   . Thyroid disease Neg Hx     History reviewed. No pertinent surgical history. Social History   Occupational History  . Not on file  Tobacco Use  . Smoking status: Never Smoker  . Smokeless tobacco: Never Used  Substance and Sexual Activity  . Alcohol use: Yes    Comment: rarely, socially (1 drink per occasion)  . Drug use: Never  . Sexual activity: Not on file

## 2018-10-28 ENCOUNTER — Ambulatory Visit (INDEPENDENT_AMBULATORY_CARE_PROVIDER_SITE_OTHER): Payer: 59 | Admitting: Family Medicine

## 2018-10-28 ENCOUNTER — Other Ambulatory Visit (INDEPENDENT_AMBULATORY_CARE_PROVIDER_SITE_OTHER): Payer: Self-pay | Admitting: Family Medicine

## 2018-10-28 DIAGNOSIS — L853 Xerosis cutis: Secondary | ICD-10-CM

## 2018-10-28 DIAGNOSIS — R7989 Other specified abnormal findings of blood chemistry: Secondary | ICD-10-CM

## 2018-10-28 NOTE — Progress Notes (Signed)
Thyroid labwork drawn.

## 2018-10-28 NOTE — Progress Notes (Signed)
Here for nurse visit lab draw.

## 2018-10-29 ENCOUNTER — Telehealth (INDEPENDENT_AMBULATORY_CARE_PROVIDER_SITE_OTHER): Payer: Self-pay | Admitting: Family Medicine

## 2018-10-29 NOTE — Telephone Encounter (Signed)
TSH up again at 4.8.  Others normal so far.

## 2018-10-31 LAB — T4, FREE: Free T4: 1.2 ng/dL (ref 0.8–1.8)

## 2018-10-31 LAB — T3, REVERSE: T3, Reverse: 16 ng/dL (ref 8–25)

## 2018-10-31 LAB — THYROGLOBULIN LEVEL: Thyroglobulin: 25.9 ng/mL

## 2018-10-31 LAB — THYROID PEROXIDASE ANTIBODIES (TPO) (REFL): Thyroperoxidase Ab SerPl-aCnc: <1 IU/mL (ref <9)

## 2018-10-31 LAB — TSH: TSH: 4.8 mIU/L — ABNORMAL HIGH

## 2018-10-31 LAB — T3, FREE: T3, Free: 2.6 pg/mL (ref 2.3–4.2)

## 2019-01-18 ENCOUNTER — Encounter (INDEPENDENT_AMBULATORY_CARE_PROVIDER_SITE_OTHER): Payer: Self-pay | Admitting: Family Medicine

## 2019-03-19 ENCOUNTER — Other Ambulatory Visit: Payer: Self-pay

## 2019-03-19 ENCOUNTER — Ambulatory Visit: Payer: 59 | Admitting: Family Medicine

## 2019-03-19 ENCOUNTER — Encounter: Payer: Self-pay | Admitting: Family Medicine

## 2019-03-19 ENCOUNTER — Ambulatory Visit (INDEPENDENT_AMBULATORY_CARE_PROVIDER_SITE_OTHER): Payer: 59 | Admitting: Family Medicine

## 2019-03-19 DIAGNOSIS — M545 Low back pain: Secondary | ICD-10-CM | POA: Diagnosis not present

## 2019-03-19 DIAGNOSIS — G8929 Other chronic pain: Secondary | ICD-10-CM

## 2019-03-19 NOTE — Progress Notes (Signed)
Office Visit Note   Patient: Stephanie Walton           Date of Birth: 07-Oct-1970           MRN: [id number] Visit Date: 03/19/2019 Requested by: Eunice Blase, MD [address] [place],  [place] [address] PCP: Eunice Blase, MD  Subjective: Chief Complaint  Patient presents with  . Lower Back - Pain    Left lower back pain - happens every June for 3 years. First time, the pain occurred after a yoga class.    HPI: He is here with left-sided low back pain.  About 3 years ago she had her first episode of pain, she was in a yoga class lying on her back, her legs were raised upward and she slowly lowered them to the floor and during that maneuver, she felt a sudden fairly severe pain in the left lower back.  It took a couple weeks for the pain to go away and she was fine but has had a couple flareups of pain in the same area since then.  Pain does not radiate down the leg, no bowel or bladder dysfunction, no weakness or numbness.  Meloxicam helps.  Incidentally, she states that her father was recently diagnosed with stage IV cholangiocarcinoma and this has been very stressful for her.               ROS: No fevers or chills.  All other systems were reviewed and are negative.  Objective: Vital Signs: There were no vitals taken for this visit.  Physical Exam:  General:  Alert and oriented, in no acute distress. Pulm:  Breathing unlabored. Psy:  Normal mood, congruent affect. Skin: No rash on her skin. Low back: No scoliosis, no tenderness over the spinous processes.  Mild tenderness near the left SI joint, maximum tenderness in the paraspinous muscle near the L4-5 level.  There is a palpable trigger point that seems to reproduce her pain.  Straight leg raise negative, lower extremity strength and reflexes are normal.  Imaging: None today.  Assessment & Plan: 1.  Recurrent left-sided low back pain, suspect myofascial pain. -Trial of physical therapy for myofascial release techniques,  dry needling. -If symptoms persist she will contact me and we will order x-rays and MRI scan. -She did not want any medications but I can call some in if she changes her mind.     Procedures: No procedures performed  No notes on file     PMFS History: Patient Active Problem List   Diagnosis Date Noted  . Elevated TSH 10/22/2018  . 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 No pertinent past medical history.  Family History  Problem Relation Age of Onset  . Diabetes Father        Prediabetes  . Prostate cancer Father   . Cancer Father   . Heart disease Paternal Grandmother   . Prostate cancer Paternal Grandfather   . Cancer Paternal Grandfather   . Thyroid disease Neg Hx     History reviewed. No pertinent surgical history. Social History   Occupational History  . Not on file  Tobacco Use  . Smoking status: Never Smoker  . Smokeless tobacco: Never Used  Substance and Sexual Activity  . Alcohol use: Yes    Comment: rarely, socially (1 drink per occasion)  . Drug use: Never  . Sexual activity: Not on file

## 2019-03-27 ENCOUNTER — Ambulatory Visit: Payer: 59 | Admitting: Physical Therapy

## 2019-04-01 ENCOUNTER — Other Ambulatory Visit: Payer: Self-pay

## 2019-04-01 ENCOUNTER — Encounter: Payer: Self-pay | Admitting: Physical Therapy

## 2019-04-01 ENCOUNTER — Ambulatory Visit: Payer: 59 | Attending: Family Medicine | Admitting: Physical Therapy

## 2019-04-01 DIAGNOSIS — M545 Low back pain, unspecified: Secondary | ICD-10-CM

## 2019-04-01 DIAGNOSIS — G8929 Other chronic pain: Secondary | ICD-10-CM

## 2019-04-01 DIAGNOSIS — M6281 Muscle weakness (generalized): Secondary | ICD-10-CM | POA: Insufficient documentation

## 2019-04-01 NOTE — Patient Instructions (Signed)
Access Code: [email or web address]  URL: [email or web address]  Date: 04/01/2019  Prepared by: Venetia Night Beuhring   Exercises  Hip Flexor Stretch with Chair - 3 reps - 2 sets - 30 hold - 1x daily - 7x weekly  Prone Transversus Abdominus Contraction - 5 reps - 3 sets - 5 hold - 1x daily - 7x weekly  Patient Education  Trigger Point Dry Needling

## 2019-04-01 NOTE — Therapy (Signed)
[facility] [address] [place], [place], [address] Phone: [phone]   Fax:  [phone]  Physical Therapy Evaluation  Patient Details  Name: Stephanie Walton MRN: [id number] Date of Birth: June 03, 1971 Referring Provider (PT): Lavada MesiHilts, Michael, MD   Encounter Date: 04/01/2019  PT End of Session - 04/01/19 1912    Visit Number  1    Date for PT Re-Evaluation  05/27/19    Authorization Type  UHC    PT Start Time  1507    PT Stop Time  1558    PT Time Calculation (min)  51 min    Activity Tolerance  Patient tolerated treatment well    Behavior During Therapy  [facility] for tasks assessed/performed       History reviewed. No pertinent past medical history.  History reviewed. No pertinent surgical history.  There were no vitals filed for this visit.   Subjective Assessment - 04/01/19 1507    Subjective  Pt states that approx 3 years ago she hurt her low back doing a leg lowering exercise while lying supine.  Felt acute pain and it progressively got tighter.  It resolved with meds and rest but has recurred once yearly each June while trimming bushes in yard and Chief of Staffhauling suitcase during travel.  She states pain is mild at this time and located in left low back along muscle.  It does not radiate.    Pertinent History  recurring back pain    Limitations  Sitting    How long can you sit comfortably?  1 hour, uses a standing desk    How long can you stand comfortably?  unlimited    How long can you walk comfortably?  unlimited    Diagnostic tests  diagnostic US    Patient Stated Goals  resolve pain and strengthen to avoid recurrence of pain/injury    Currently in Pain?  Yes    Pain Score  1     Pain Location  Back    Pain Orientation  Left;Lower    Pain Descriptors / Indicators  Aching;Sharp;Spasm    Pain Type  Chronic pain    Pain Onset  More than a month ago    Pain Frequency  Intermittent    Aggravating Factors   sitting >  1 hour especially in bad posture    Pain Relieving Factors  heat/ice alternating, stretching in child's pose    Effect of Pain on Daily Activities  work tasks Comptroller(vet medication design)         [facility] PT Assessment - 04/01/19 0001      Assessment   Medical Diagnosis  M54.5,G89.29 (ICD-10-CM) - Chronic left-sided low back pain without sciatica    Referring Provider (PT)  Hilts, Michael, MD    Onset Date/Surgical Date  --   approx 3 years initial injury, recurrs every year   Hand Dominance  Right    Next MD Visit  no    Prior Therapy  no      Precautions   Precautions  None      Restrictions   Weight Bearing Restrictions  No      Balance Screen   Has the patient fallen in the past 6 months  No    Has the patient had a decrease in activity level because of a fear of falling?   No    Is the patient reluctant to leave their home because of a fear of falling?   No  Home Environment   Living Environment  Private residence    Living Arrangements  Spouse/significant other    Type of Home  House    Home Access  Stairs to enter    Home Layout  Multi-level    Home Equipment  None      Prior Function   Level of Independence  Independent      Cognition   Overall Cognitive Status  Within Functional Limits for tasks assessed      Observation/Other Assessments   Observations  no gross postural or gait abnormalities    Focus on Therapeutic Outcomes (FOTO)   30%   goal 22%     Sensation   Light Touch  Appears Intact      Posture/Postural Control   Posture/Postural Control  No significant limitations      ROM / Strength   AROM / PROM / Strength  AROM;Strength      AROM   Overall AROM Comments  all lumbar ROM WNL with mild discomfort end range Lt SB and extension      Strength   Overall Strength Comments  bil LEs WNL throughout, mild weakness Lt knee ext 4+/5      Flexibility   Soft Tissue Assessment /Muscle Length  yes   hip flexors limited by 20%, Lt > Rt     Palpation    Spinal mobility  PA provocation and rotational provocation tests negative    SI assessment   WNL    Palpation comment  Lt L4/5 multifidus, thoracic and lumbar paraspinals on Lt from approx T8-L3 tender                Objective measurements completed on examination: See above findings.              PT Education - 04/01/19 1553    Education Details  Access Code: [email or web address]    Person(s) Educated  Patient    Methods  Explanation;Demonstration;Verbal cues;Handout    Comprehension  Verbalized understanding;Returned demonstration       PT Short Term Goals - 04/01/19 1923      PT SHORT TERM GOAL #1   Title  be independent in initial HEP    Time  4    Period  Weeks    Status  New    Target Date  04/29/19      PT SHORT TERM GOAL #2   Title  Demo proper seated posture to reduce pain in sitting for work and driving.    Time  4    Period  Weeks    Status  New    Target Date  04/29/19      PT SHORT TERM GOAL #3   Title  Pt will demo proper recruitment of core muscles in sitting and standing to build base on which to build global strength.    Time  4    Period  Weeks    Status  New    Target Date  04/29/19        PT Long Term Goals - 04/01/19 1926      PT LONG TERM GOAL #1   Title  be independent in advanced HEP and understand how to safely progress.    Time  8    Period  Weeks    Status  New    Target Date  05/27/19      PT LONG TERM GOAL #2   Title  reduce FOTO to < or = 22% limitation  Time  8    Period  Weeks    Status  New    Target Date  05/27/19      PT LONG TERM GOAL #3   Title  Pt will achieve hip flexor flexibility to WNL bil to reduce negative influence on low back posture.    Time  8    Period  Weeks    Status  New    Target Date  05/27/19      PT LONG TERM GOAL #4   Title  Pt will report at least 75% reduction in pain throughout daily tasks.    Time  8    Period  Weeks    Status  New    Target Date  05/27/19      PT LONG  TERM GOAL #5   Title  Pt will report at least 75% more confidence in back safety while performing household and recreational activities due to improved body mechanics and perception of strength and stability.    Time  8    Period  Weeks    Status  New    Target Date  05/27/19             Plan - 04/01/19 1913    Clinical Impression Statement  Pt is an active female who presents with history of recurring LBP on Lt without radicular symptoms following initial injury sustained while lowering legs in supine during a yoga class.  Pain is currently mild.  Pt presents with good ROM, flexibility and strength throughout spine, hips and LEs.  Some deficits are present in Lt>Rt hip flexor flexibility and pain is present at end range Lt sidebend and lumbar extension.  Pt has tension and trigger points in Lt lumbar multifidi and thoracic and lumbar paraspinals.  Core strength is fair/good but Lt multifidi needs more bulking and TrA needs to be training with dynamic overlay to prevent re-injury in the future.  Pt will benefit from skilled PT to address these deficits and improve stability and control for protection from re-injury.    Personal Factors and Comorbidities  Time since onset of injury/illness/exacerbation    Examination-Activity Limitations  Sit;Lift    Examination-Participation Restrictions  Yard Work;Laundry;Cleaning    Stability/Clinical Decision Making  Stable/Uncomplicated    Clinical Decision Making  Low    Rehab Potential  Excellent    PT Frequency  2x / week    PT Duration  8 weeks    PT Treatment/Interventions  ADLs/Self Care Home Management;Cryotherapy;Electrical Stimulation;Iontophoresis 4mg /ml Dexamethasone;Moist Heat;Traction;Functional mobility training;Therapeutic exercise;Neuromuscular re-education;Patient/family education;Manual techniques;Passive range of motion;Dry needling;Taping;Spinal Manipulations;Joint Manipulations    PT Next Visit Plan  try DN Lt lumbar if patient  wants, f/u on initial HEP, instruct proper seated posture, assess TrA recruitment, prayer with SB or seated ball rollouts, sit to stand, stand on foam pad with UE weights    PT Home Exercise Plan  Access Code: [email or web address]    Consulted and Agree with Plan of Care  Patient       Patient will benefit from skilled therapeutic intervention in order to improve the following deficits and impairments:  Increased fascial restricitons, Increased muscle spasms, Pain, Hypomobility, Impaired flexibility, Improper body mechanics, Decreased mobility, Decreased strength  Visit Diagnosis: 1. Chronic left-sided low back pain without sciatica   2. Muscle weakness (generalized)        Problem List Patient Active Problem List   Diagnosis Date Noted  . Elevated TSH 10/22/2018  . Sinus tarsi syndrome and  peroneal tendinitis of right ankle 01/09/2017    Morton PetersJohanna Beuhring, PT 04/01/19 7:34 PM   [facility] [address], [address] [place], [place], [address] Phone: [phone]   Fax:  [phone]  Name: Stephanie Walton MRN: [id number] Date of Birth: 07/14/1971

## 2019-04-03 ENCOUNTER — Other Ambulatory Visit: Payer: Self-pay

## 2019-04-03 ENCOUNTER — Encounter: Payer: Self-pay | Admitting: Family Medicine

## 2019-04-03 ENCOUNTER — Encounter: Payer: Self-pay | Admitting: Physical Therapy

## 2019-04-03 ENCOUNTER — Ambulatory Visit: Payer: 59 | Admitting: Physical Therapy

## 2019-04-03 DIAGNOSIS — G8929 Other chronic pain: Secondary | ICD-10-CM

## 2019-04-03 DIAGNOSIS — M545 Low back pain, unspecified: Secondary | ICD-10-CM

## 2019-04-03 DIAGNOSIS — M6281 Muscle weakness (generalized): Secondary | ICD-10-CM

## 2019-04-03 NOTE — Therapy (Signed)
[facility] [address] [place], [place], [address] Phone: [phone]   Fax:  [phone]  Physical Therapy Treatment  Patient Details  Name: Stephanie Walton MRN: [id number] Date of Birth: [birth year] Referring Provider (PT): Eunice Blase, MD   Encounter Date: 04/03/2019  PT End of Session - 04/03/19 1754    Visit Number  2    Date for PT Re-Evaluation  05/27/19    Authorization Type  UHC    PT Start Time  1800    PT Stop Time  1850    PT Time Calculation (min)  50 min    Activity Tolerance  Patient tolerated treatment well    Behavior During Therapy  [facility] for tasks assessed/performed       History reviewed. No pertinent past medical history.  History reviewed. No pertinent surgical history.  There were no vitals filed for this visit.  Subjective Assessment - 04/03/19 1755    Subjective  Pt states she has been working on her initial HEP and feels sore in a good way - feels like she is finding her core muscles in the low back.    Pertinent History  recurring back pain    How long can you sit comfortably?  1 hour, uses a standing desk    How long can you stand comfortably?  unlimited    How long can you walk comfortably?  unlimited    Diagnostic tests  diagnostic US    Patient Stated Goals  resolve pain and strengthen to avoid recurrence of pain/injury    Currently in Pain?  Yes    Pain Score  1     Pain Location  Back    Pain Orientation  Left;Lower    Pain Descriptors / Indicators  Aching    Pain Type  Chronic pain    Pain Onset  More than a month ago    Pain Frequency  Intermittent    Aggravating Factors   sitting    Pain Relieving Factors  heat/ice, stretching in child's pose                       [facility] Adult PT Treatment/Exercise - 04/03/19 0001      Self-Care   Self-Care  Posture    Posture  use of towel roll in sitting, proper seated and standing posture, align sternum and pubic  bone in standing, soften sternum in sitting to avoid overuse of paraspinals      Exercises   Exercises  Lumbar;Knee/Hip      Lumbar Exercises: Stretches   Other Lumbar Stretch Exercise  child's pose, child's pose with sidebending 2x20 sec each (HEP)      Lumbar Exercises: Aerobic   Recumbent Bike  L4 x 8', PT present to discuss HEP and symptoms      Lumbar Exercises: Standing   Shoulder Extension  Strengthening;15 reps;Theraband    Theraband Level (Shoulder Extension)  Level 2 (Red)    Shoulder Extension Limitations  HEP    Other Standing Lumbar Exercises  shoulder flexion red band hands to hips for lumbar multifidi x 15 reps, HEP    Other Standing Lumbar Exercises  red band trunk rotation small range for unilateral lumbar multifidi x 15 reps, HEP      Lumbar Exercises: Seated   Sit to Stand  15 reps    Sit to Stand Limitations  Pt cued hip hinge, keep sternum and breast bone equidistant from each  other, no momentum, added to HEP             PT Education - 04/03/19 1909    Education Details  Access Code: [email or web address] (updated today)    Person(s) Educated  Patient    Methods  Explanation;Demonstration;Tactile cues;Verbal cues;Handout    Comprehension  Verbalized understanding;Returned demonstration       PT Short Term Goals - 04/03/19 1755      PT SHORT TERM GOAL #1   Title  be independent in initial HEP    Status  On-going      PT SHORT TERM GOAL #2   Title  Demo proper seated posture to reduce pain in sitting for work and driving.    Status  On-going        PT Long Term Goals - 04/01/19 1926      PT LONG TERM GOAL #1   Title  be independent in advanced HEP and understand how to safely progress.    Time  8    Period  Weeks    Status  New    Target Date  05/27/19      PT LONG TERM GOAL #2   Title  reduce FOTO to < or = 22% limitation    Time  8    Period  Weeks    Status  New    Target Date  05/27/19      PT LONG TERM GOAL #3   Title  Pt will achieve hip  flexor flexibility to WNL bil to reduce negative influence on low back posture.    Time  8    Period  Weeks    Status  New    Target Date  05/27/19      PT LONG TERM GOAL #4   Title  Pt will report at least 75% reduction in pain throughout daily tasks.    Time  8    Period  Weeks    Status  New    Target Date  05/27/19      PT LONG TERM GOAL #5   Title  Pt will report at least 75% more confidence in back safety while performing household and recreational activities due to improved body mechanics and perception of strength and stability.    Time  8    Period  Weeks    Status  New    Target Date  05/27/19            Plan - 04/03/19 1910    Clinical Impression Statement  Pt with 1/10 pain which increased by a half point end of session today in Lt thoracolumbar paraspinals end of session.  Pt needed lots of cueing to properly align spine to optimize loading and use of core vs paraspinals in seated and standing postures.  She demonstrated proper understanding of self-corrections for alignment by end of session with minimal need for cueing and appears to be demonstrating improving recruitment of deep core muscles.  At this time she is still unsure whether she would like to try dry needling.  Next session will benefit from follow up on updated HEP from today's skills and some manual therapy for paraspinal tightness/myofasical restrictions.    Rehab Potential  Excellent    PT Frequency  2x / week    PT Duration  8 weeks    PT Treatment/Interventions  ADLs/Self Care Home Management;Cryotherapy;Electrical Stimulation;Iontophoresis 4mg /ml Dexamethasone;Moist Heat;Traction;Functional mobility training;Therapeutic exercise;Neuromuscular re-education;Patient/family education;Manual techniques;Passive range of motion;Dry needling;Taping;Spinal Manipulations;Joint Manipulations  PT Next Visit Plan  Lt thoracolumbar paraspinal soft tissue mobs, myofascial release, DN if interested, f/u on HEP, add  standing on foam or BOSU with UE weights    PT Home Exercise Plan  Access Code: [email or web address]    Consulted and Agree with Plan of Care  Patient       Patient will benefit from skilled therapeutic intervention in order to improve the following deficits and impairments:     Visit Diagnosis: 1. Chronic left-sided low back pain without sciatica   2. Muscle weakness (generalized)        Problem List Patient Active Problem List   Diagnosis Date Noted  . Elevated TSH 10/22/2018  . 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 [place], [place], [address] Phone: [phone]   Fax:  [phone]  Name: Stephanie Walton MRN: [id number] Date of Birth: 1971/04/02

## 2019-04-03 NOTE — Patient Instructions (Signed)
Access Code: [email or web address]  URL: [email or web address]  Date: 04/03/2019  Prepared by: Venetia Night Brailen Macneal   Exercises  Hip Flexor Stretch with Chair - 3 reps - 2 sets - 30 hold - 1x daily - 7x weekly  Prone Transversus Abdominus Contraction - 5 reps - 3 sets - 5 hold - 1x daily - 7x weekly  Child's Pose Stretch - 2 reps - 1 sets - 20 hold - 1x daily - 7x weekly  Child's Pose with Sidebending - 2 reps - 1 sets - 20 hold - 1x daily - 7x weekly  Shoulder extension with resistance - Neutral - 10 reps - 2 sets - 1x daily - 7x weekly  Standing Shoulder Flexion with Resistance - 10 reps - 2 sets - 1x daily - 7x weekly  Standing Trunk Rotation with Resistance - 10 reps - 2 sets - 1x daily - 7x weekly  Sit to Stand - 20 reps - 1 sets - 1x daily - 7x weekly  Patient Education  Trigger Point Dry Needling

## 2019-04-07 ENCOUNTER — Encounter: Payer: Self-pay | Admitting: Physical Therapy

## 2019-04-07 ENCOUNTER — Ambulatory Visit: Payer: 59 | Admitting: Physical Therapy

## 2019-04-07 ENCOUNTER — Other Ambulatory Visit: Payer: Self-pay

## 2019-04-07 ENCOUNTER — Telehealth: Payer: Self-pay | Admitting: Family Medicine

## 2019-04-07 DIAGNOSIS — M545 Low back pain: Secondary | ICD-10-CM | POA: Diagnosis not present

## 2019-04-07 DIAGNOSIS — M6281 Muscle weakness (generalized): Secondary | ICD-10-CM

## 2019-04-07 DIAGNOSIS — G8929 Other chronic pain: Secondary | ICD-10-CM

## 2019-04-07 MED ORDER — ETONOGESTREL-ETHINYL ESTRADIOL 0.12-0.015 MG/24HR VA RING: VAGINAL_RING | VAGINAL | 4 refills | Status: DC

## 2019-04-07 NOTE — Telephone Encounter (Signed)
Rx sent 

## 2019-04-07 NOTE — Telephone Encounter (Signed)
Please advise 

## 2019-04-07 NOTE — Patient Instructions (Signed)
Access Code: [email or web address]  URL: [email or web address]  Date: 04/07/2019  Prepared by: Venetia Night Monaye Blackie   Exercises  Hip Flexor Stretch with Chair - 3 reps - 2 sets - 30 hold - 1x daily - 7x weekly  Prone Transversus Abdominus Contraction - 5 reps - 3 sets - 5 hold - 1x daily - 7x weekly  Child's Pose Stretch - 2 reps - 1 sets - 20 hold - 1x daily - 7x weekly  Child's Pose with Sidebending - 2 reps - 1 sets - 20 hold - 1x daily - 7x weekly  Shoulder extension with resistance - Neutral - 10 reps - 2 sets - 1x daily - 7x weekly  Standing Shoulder Flexion with Resistance - 10 reps - 2 sets - 1x daily - 7x weekly  Standing Trunk Rotation with Resistance - 10 reps - 2 sets - 1x daily - 7x weekly  Sit to Stand - 20 reps - 1 sets - 1x daily - 7x weekly  Cat-Camel - 10 reps - 1 sets - 1x daily - 7x weekly  Quadruped Thoracic Rotation with Hand on Neck - 5 reps - 2 sets - 1x daily - 7x weekly  Quadruped Thoracic Rotation - Reach Under - 5 reps - 1 sets - 1x daily - 7x weekly  Sidelying Thoracic Rotation with Open Book - 10 reps - 1 sets - 1x daily - 7x weekly  Supine Bridge - 10 reps - 2 sets - 1x daily - 7x weekly  Patient Education  Trigger Point Dry Needling

## 2019-04-07 NOTE — Telephone Encounter (Signed)
Patient called in wanting a scirpt of her etonogestrel-ethinyl estradiol (NUVARING) 0.12-0.015 MG/24HR vaginal ring. Said she's completely out and would like it to be sent to the pharmamcy. [facility] is how she is filling her scripts. Patient is trying to not get off track from the [facility] and is needing to take it this week if at all possible   Please advise [phone]

## 2019-04-07 NOTE — Telephone Encounter (Signed)
I advised the patient

## 2019-04-07 NOTE — Therapy (Signed)
[facility] [address], [address] [place], [place], [address] Phone: [phone]   Fax:  [phone]  Physical Therapy Treatment  Patient Details  Name: Stephanie Walton MRN: [id number] Date of Birth: 06-26-1971 Referring Provider (PT): Lavada MesiHilts, Michael, MD   Encounter Date: 04/07/2019  PT End of Session - 04/07/19 1950    Visit Number  3    Date for PT Re-Evaluation  05/27/19    Authorization Type  UHC    PT Start Time  1900    PT Stop Time  1945    PT Time Calculation (min)  45 min    Activity Tolerance  Patient tolerated treatment well    Behavior During Therapy  [facility] for tasks assessed/performed       History reviewed. No pertinent past medical history.  History reviewed. No pertinent surgical history.  There were no vitals filed for this visit.  Subjective Assessment - 04/07/19 1901    Subjective  Pt states pain went from 1/10 to 2/10 after last visit for soreness but it only lasted a day.  I like the exercises.  I feel like I'm getting really good posture.  I was able to go mountain biking over the weekend without pain.    Pertinent History  recurring back pain    Limitations  Sitting    How long can you sit comfortably?  1 hour, uses a standing desk    How long can you stand comfortably?  unlimited    How long can you walk comfortably?  unlimited    Diagnostic tests  diagnostic US    Patient Stated Goals  resolve pain and strengthen to avoid recurrence of pain/injury    Currently in Pain?  Yes    Pain Score  1     Pain Location  Back    Pain Orientation  Left;Lower    Pain Descriptors / Indicators  Aching    Pain Type  Chronic pain    Pain Onset  More than a month ago    Pain Frequency  Intermittent    Aggravating Factors   sitting    Pain Relieving Factors  heat/ice, stretching    Effect of Pain on Daily Activities  sitting, work tasks    Multiple Pain Sites  No                       [facility]  Adult PT Treatment/Exercise - 04/07/19 0001      Lumbar Exercises: Aerobic   Recumbent Bike  L4 x 8', PT present to discuss HEP progress and weekend activities      Lumbar Exercises: Standing   Shoulder Extension  Strengthening;15 reps;Theraband    Theraband Level (Shoulder Extension)  Level 2 (Red)    Shoulder Extension Limitations  PT cued spinal alignment posture (bring pelvis back under trunk) and core cueing    Other Standing Lumbar Exercises  shoulder flexion red band hands to hips for lumbar multifidi x 15 reps, HEP      Lumbar Exercises: Supine   Bridge  10 reps    Bridge Limitations  added to HEP      Lumbar Exercises: Sidelying   Other Sidelying Lumbar Exercises  open books x 10 bil, add prolonged hold on 10th rep x 20 sec      Lumbar Exercises: Quadruped   Madcat/Old Horse  10 reps    Other Quadruped Lumbar Exercises  thoracic rotation hand on neck x 5 bil,  thread the needle x 5 bil      Manual Therapy   Manual Therapy  Joint mobilization;Myofascial release    Joint Mobilization  PAs Gr I-III T10-L3 prone    Myofascial Release  Lt thoracic paraspinals, thoracodorsal fascia             PT Education - 04/07/19 1950    Education Details  Access Code: [email or web address]    Person(s) Educated  Patient    Methods  Explanation;Demonstration;Verbal cues;Handout    Comprehension  Verbalized understanding;Returned demonstration       PT Short Term Goals - 04/07/19 2001      PT SHORT TERM GOAL #1   Title  be independent in initial HEP    Status  Achieved      PT SHORT TERM GOAL #2   Title  Demo proper seated posture to reduce pain in sitting for work and driving.    Status  Achieved      PT SHORT TERM GOAL #3   Title  Pt will demo proper recruitment of core muscles in sitting and standing to build base on which to build global strength.    Status  On-going        PT Long Term Goals - 04/01/19 1926      PT LONG TERM GOAL #1   Title  be independent in advanced HEP  and understand how to safely progress.    Time  8    Period  Weeks    Status  New    Target Date  05/27/19      PT LONG TERM GOAL #2   Title  reduce FOTO to < or = 22% limitation    Time  8    Period  Weeks    Status  New    Target Date  05/27/19      PT LONG TERM GOAL #3   Title  Pt will achieve hip flexor flexibility to WNL bil to reduce negative influence on low back posture.    Time  8    Period  Weeks    Status  New    Target Date  05/27/19      PT LONG TERM GOAL #4   Title  Pt will report at least 75% reduction in pain throughout daily tasks.    Time  8    Period  Weeks    Status  New    Target Date  05/27/19      PT LONG TERM GOAL #5   Title  Pt will report at least 75% more confidence in back safety while performing household and recreational activities due to improved body mechanics and perception of strength and stability.    Time  8    Period  Weeks    Status  New    Target Date  05/27/19            Plan - 04/07/19 1955    Clinical Impression Statement  Pt with report of soreness end of session from manual therapy and feeling of "weakness" in her back.  PT targeted release of Lt paraspinal muscles and associated fascia with manual therapy which then needed follow up core cueing of deep muscles to instill improved stabilization strategy.  Pt felt improved stability and less weakness end of session following spinal ROM coupled with reps of core exercises from last visit.  PT updated HEP today for targeted ROM and stretching.  Pt will continue to benefit from skilled manual  PT and stabilization training along current POC.  She is not comfortable with DN at this time.    PT Frequency  2x / week    PT Duration  8 weeks    PT Treatment/Interventions  ADLs/Self Care Home Management;Cryotherapy;Electrical Stimulation;Iontophoresis 4mg /ml Dexamethasone;Moist Heat;Traction;Functional mobility training;Therapeutic exercise;Neuromuscular re-education;Patient/family  education;Manual techniques;Passive range of motion;Dry needling;Taping;Spinal Manipulations;Joint Manipulations    PT Next Visit Plan  f/u on response to manual therapy and updated HEP, continue myofasical release, spinal mobs as tolerated and continue to build lumbopelvic stability program    PT Home Exercise Plan  Access Code: [email or web address]    Consulted and Agree with Plan of Care  Patient       Patient will benefit from skilled therapeutic intervention in order to improve the following deficits and impairments:     Visit Diagnosis: 1. Chronic left-sided low back pain without sciatica   2. Muscle weakness (generalized)        Problem List Patient Active Problem List   Diagnosis Date Noted  . Elevated TSH 10/22/2018  . 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, [address] [place], [place], [address] Phone: [phone]   Fax:  [phone]  Name: Stephanie Walton MRN: [id number] Date of Birth: Jun 29, 1971

## 2019-04-10 ENCOUNTER — Other Ambulatory Visit: Payer: Self-pay

## 2019-04-10 ENCOUNTER — Ambulatory Visit: Payer: 59 | Admitting: Physical Therapy

## 2019-04-10 ENCOUNTER — Encounter: Payer: Self-pay | Admitting: Physical Therapy

## 2019-04-10 DIAGNOSIS — G8929 Other chronic pain: Secondary | ICD-10-CM

## 2019-04-10 DIAGNOSIS — M545 Low back pain, unspecified: Secondary | ICD-10-CM

## 2019-04-10 DIAGNOSIS — M6281 Muscle weakness (generalized): Secondary | ICD-10-CM

## 2019-04-10 NOTE — Patient Instructions (Signed)
Access Code: [email or web address]  URL: [email or web address]  Date: 04/10/2019  Prepared by: Ruben Im   Exercises  Hip Flexor Stretch with Chair - 3 reps - 2 sets - 30 hold - 1x daily - 7x weekly  Prone Transversus Abdominus Contraction - 5 reps - 3 sets - 5 hold - 1x daily - 7x weekly  Child's Pose Stretch - 2 reps - 1 sets - 20 hold - 1x daily - 7x weekly  Child's Pose with Sidebending - 2 reps - 1 sets - 20 hold - 1x daily - 7x weekly  Shoulder extension with resistance - Neutral - 10 reps - 2 sets - 1x daily - 7x weekly  Standing Shoulder Flexion with Resistance - 10 reps - 2 sets - 1x daily - 7x weekly  Standing Trunk Rotation with Resistance - 10 reps - 2 sets - 1x daily - 7x weekly  Sit to Stand - 20 reps - 1 sets - 1x daily - 7x weekly  Cat-Camel - 10 reps - 1 sets - 1x daily - 7x weekly  Quadruped Thoracic Rotation with Hand on Neck - 5 reps - 2 sets - 1x daily - 7x weekly  Quadruped Thoracic Rotation - Reach Under - 5 reps - 1 sets - 1x daily - 7x weekly  Sidelying Thoracic Rotation with Open Book - 10 reps - 1 sets - 1x daily - 7x weekly  Supine Bridge - 10 reps - 2 sets - 1x daily - 7x weekly  Standing Anti-Rotation Press with Anchored Resistance - 10 reps - 1 sets - 1x daily - 7x weekly  Patient Education  Trigger Point Dry Needling

## 2019-04-10 NOTE — Therapy (Signed)
[facility] [address], [address] [place], [place], [address] Phone: [phone]   Fax:  [phone]  Physical Therapy Treatment  Patient Details  Name: Stephanie HindGail Walton MRN: [id number] Date of Birth: Jul 24, 1971 Referring Provider (PT): Lavada MesiHilts, Michael, MD   Encounter Date: 04/10/2019  PT End of Session - 04/10/19 1642    Visit Number  4    Date for PT Re-Evaluation  05/27/19    Authorization Type  UHC    PT Start Time  1535    PT Stop Time  1625    PT Time Calculation (min)  50 min    Activity Tolerance  Patient tolerated treatment well       History reviewed. No pertinent past medical history.  History reviewed. No pertinent surgical history.  There were no vitals filed for this visit.  Subjective Assessment - 04/10/19 1533    Subjective  My pain went up to a 3/10 initially but then it went back down.  I've been doing some reading. I love the exercises and I do the band ex's often.  I watched videos on dry needling and I don't think I can do that.    Currently in Pain?  Yes    Pain Score  1     Pain Location  Back    Pain Orientation  Lower    Pain Type  Chronic pain                       [facility] Adult PT Treatment/Exercise - 04/10/19 0001      Neuro Re-ed    Neuro Re-ed Details   neuroscience of pain education including alarm system analogies and CEO story of central sensitization       Lumbar Exercises: Aerobic   Recumbent Bike  L4 x 7', PT present to discuss HEP progress and weekend activities      Lumbar Exercises: Standing   Shoulder Extension  Strengthening;15 reps;Theraband    Theraband Level (Shoulder Extension)  Level 2 (Red)    Shoulder Extension Limitations  while standing on foam and SLS 10x 2 each     Other Standing Lumbar Exercises  shoulder flexion red band hands to hips for lumbar multifidi x 15 reps, HEP with foam pad with SLS 2x 10 each     Other Standing Lumbar Exercises  hold red  band in 2 hands press forward and overhead 10x each right/left       Manual Therapy   Myofascial Release  Graston instrument assisted MFR to left paraspinals in prone and childs pose 8 min              PT Education - 04/10/19 1636    Education Details  Access Code: [email or web address]  progression of hip flexor stretch on step with UE reaches;  progression of red band ex with standing on foam and SLS    Person(s) Educated  Patient    Methods  Explanation;Demonstration;Handout    Comprehension  Verbalized understanding;Returned demonstration       PT Short Term Goals - 04/07/19 2001      PT SHORT TERM GOAL #1   Title  be independent in initial HEP    Status  Achieved      PT SHORT TERM GOAL #2   Title  Demo proper seated posture to reduce pain in sitting for work and driving.    Status  Achieved      PT SHORT TERM GOAL #3  Title  Pt will demo proper recruitment of core muscles in sitting and standing to build base on which to build global strength.    Status  On-going        PT Long Term Goals - 04/01/19 1926      PT LONG TERM GOAL #1   Title  be independent in advanced HEP and understand how to safely progress.    Time  8    Period  Weeks    Status  New    Target Date  05/27/19      PT LONG TERM GOAL #2   Title  reduce FOTO to < or = 22% limitation    Time  8    Period  Weeks    Status  New    Target Date  05/27/19      PT LONG TERM GOAL #3   Title  Pt will achieve hip flexor flexibility to WNL bil to reduce negative influence on low back posture.    Time  8    Period  Weeks    Status  New    Target Date  05/27/19      PT LONG TERM GOAL #4   Title  Pt will report at least 75% reduction in pain throughout daily tasks.    Time  8    Period  Weeks    Status  New    Target Date  05/27/19      PT LONG TERM GOAL #5   Title  Pt will report at least 75% more confidence in back safety while performing household and recreational activities due to improved body  mechanics and perception of strength and stability.    Time  8    Period  Weeks    Status  New    Target Date  05/27/19            Plan - 04/10/19 1643    Clinical Impression Statement  The patient reports a good response to manual therapy last visit.  Demonstrates regular compliance with HEP and is receptive to a progression of challenge and intensity of unstable surface and single leg standing.  Patient lacks strength to avoid rotating in compensation with windmill position as she demonstrates her figure skating maneuver.  She would benefit from a further progression of lumbo/pelvic/hip strengthening and further education on the neuroscience of pain and central sensitization and it's role in chronic pain.  Therapist providing verbal and tactile cues for postural alignment.    Examination-Participation Restrictions  Yard Work;Laundry;Cleaning    Rehab Potential  Excellent    PT Frequency  2x / week    PT Duration  8 weeks    PT Treatment/Interventions  ADLs/Self Care Home Management;Cryotherapy;Electrical Stimulation;Iontophoresis 4mg /ml Dexamethasone;Moist Heat;Traction;Functional mobility training;Therapeutic exercise;Neuromuscular re-education;Patient/family education;Manual techniques;Passive range of motion;Dry needling;Taping;Spinal Manipulations;Joint Manipulations    PT Next Visit Plan  f/u on response to manual therapy and updated HEP, continue myofasical release, lumbopelvic stability program; pain education    PT Home Exercise Plan  Access Code: [email or web address]       Patient will benefit from skilled therapeutic intervention in order to improve the following deficits and impairments:  Increased fascial restricitons, Increased muscle spasms, Pain, Hypomobility, Impaired flexibility, Improper body mechanics, Decreased mobility, Decreased strength  Visit Diagnosis: 1. Chronic left-sided low back pain without sciatica   2. Muscle weakness (generalized)        Problem List Patient  Active Problem List   Diagnosis Date Noted  .  Elevated TSH 10/22/2018  . 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 [place], [place], [address] Phone: [phone]   Fax:  [phone]  Name: Stephanie Walton MRN: [id number] Date of Birth: [birth year]

## 2019-04-15 ENCOUNTER — Other Ambulatory Visit: Payer: Self-pay

## 2019-04-15 ENCOUNTER — Ambulatory Visit: Payer: 59 | Admitting: Physical Therapy

## 2019-04-15 ENCOUNTER — Encounter: Payer: Self-pay | Admitting: Physical Therapy

## 2019-04-15 DIAGNOSIS — M545 Low back pain, unspecified: Secondary | ICD-10-CM

## 2019-04-15 DIAGNOSIS — M6281 Muscle weakness (generalized): Secondary | ICD-10-CM

## 2019-04-15 DIAGNOSIS — G8929 Other chronic pain: Secondary | ICD-10-CM

## 2019-04-15 NOTE — Therapy (Signed)
[facility] [address] [place], [place], [address] Phone: [phone]   Fax:  [phone]  Physical Therapy Treatment  Patient Details  Name: Stephanie Walton MRN: [id number] Date of Birth: 24-Nov-1970 Referring Provider (PT): Eunice Blase, MD   Encounter Date: 04/15/2019  PT End of Session - 04/15/19 1534    Visit Number  5    Date for PT Re-Evaluation  05/27/19    Authorization Type  UHC    PT Start Time  1324    PT Stop Time  1625    PT Time Calculation (min)  50 min    Activity Tolerance  Patient tolerated treatment well       History reviewed. No pertinent past medical history.  History reviewed. No pertinent surgical history.  There were no vitals filed for this visit.  Subjective Assessment - 04/15/19 1536    Subjective  I feel rushed today getting here from work.   The mild sensation in my back  continues since the last visit.  I've been doing lots of cat/cow, childs pose, open books, and staggered standing core work. I use the band a lot.    Patient Stated Goals  resolve pain and strengthen to avoid recurrence of pain/injury    Currently in Pain?  Yes    Pain Score  1     Pain Location  Back    Pain Orientation  Lower    Pain Type  Chronic pain                       [facility] Adult PT Treatment/Exercise - 04/15/19 0001      Therapeutic Activites    Therapeutic Activities  Lifting;Other Therapeutic Activities    Lifting  10# dead lift to mid shin level 2x10    Other Therapeutic Activities  hip hinge with golf club      Lumbar Exercises: Stretches   Other Lumbar Stretch Exercise  psoas doorway with and without UE elevation 3x 5 right/left       Lumbar Exercises: Standing   Lifting Limitations  windmill using UE support 8x right/left     Other Standing Lumbar Exercises  1/2 kneel and tall kneel with and without red band UE movements     Other Standing Lumbar Exercises  red band  steering wheels       Lumbar Exercises: Seated   Sit to Stand  15 reps    Sit to Stand Limitations  holding 10# weight       Manual Therapy   Kinesiotex  Facilitate Muscle      Kinesiotix   Facilitate Muscle   star pattern              PT Education - 04/15/19 1633    Education Details  Access Code: [email or web address] half kneeling and tall kneeling    Person(s) Educated  Patient    Methods  Explanation;Demonstration;Handout    Comprehension  Returned demonstration;Verbalized understanding       PT Short Term Goals - 04/07/19 2001      PT SHORT TERM GOAL #1   Title  be independent in initial HEP    Status  Achieved      PT SHORT TERM GOAL #2   Title  Demo proper seated posture to reduce pain in sitting for work and driving.    Status  Achieved      PT SHORT TERM GOAL #3   Title  Pt  will demo proper recruitment of core muscles in sitting and standing to build base on which to build global strength.    Status  On-going        PT Long Term Goals - 04/01/19 1926      PT LONG TERM GOAL #1   Title  be independent in advanced HEP and understand how to safely progress.    Time  8    Period  Weeks    Status  New    Target Date  05/27/19      PT LONG TERM GOAL #2   Title  reduce FOTO to < or = 22% limitation    Time  8    Period  Weeks    Status  New    Target Date  05/27/19      PT LONG TERM GOAL #3   Title  Pt will achieve hip flexor flexibility to WNL bil to reduce negative influence on low back posture.    Time  8    Period  Weeks    Status  New    Target Date  05/27/19      PT LONG TERM GOAL #4   Title  Pt will report at least 75% reduction in pain throughout daily tasks.    Time  8    Period  Weeks    Status  New    Target Date  05/27/19      PT LONG TERM GOAL #5   Title  Pt will report at least 75% more confidence in back safety while performing household and recreational activities due to improved body mechanics and perception of strength and stability.     Time  8    Period  Weeks    Status  New    Target Date  05/27/19            Plan - 04/15/19 1624    Clinical Impression Statement  The patient reports very mild lower back discomfort which does not limit her ability to participate in a progression of core strengthening.  She reports gluteal region muscular fatigue with SLS/windmill.  Following instruction the patient demonstrates good technique with dead lifting and hip hinging with light weight.  Therapist closely monitoring response and providing verbal cues for patellofemoral alignment and to ensure proper lifting form.  On track to meet STGs.    PT Frequency  2x / week    PT Duration  8 weeks    PT Treatment/Interventions  ADLs/Self Care Home Management;Cryotherapy;Electrical Stimulation;Iontophoresis 4mg /ml Dexamethasone;Moist Heat;Traction;Functional mobility training;Therapeutic exercise;Neuromuscular re-education;Patient/family education;Manual techniques;Passive range of motion;Dry needling;Taping;Spinal Manipulations;Joint Manipulations    PT Next Visit Plan  try windmill ex with 1 foot supported against wall;  progress dead lifting weight to 20#;  assess response to KT;  core strengthening;  pain education;  traveling out of town for work    PT Home Exercise Plan  Access Code: [email or web address]       Patient will benefit from skilled therapeutic intervention in order to improve the following deficits and impairments:  Increased fascial restricitons, Increased muscle spasms, Pain, Hypomobility, Impaired flexibility, Improper body mechanics, Decreased mobility, Decreased strength  Visit Diagnosis: 1. Chronic left-sided low back pain without sciatica   2. Muscle weakness (generalized)        Problem List Patient Active Problem List   Diagnosis Date Noted  . Elevated TSH 10/22/2018  . 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, [address] [place], [place], [address] Phone: [phone]   Fax:  [phone]  Name: Stephanie Walton MRN: [id number] Date of Birth: Mar 26, 1971

## 2019-04-15 NOTE — Patient Instructions (Signed)
Access Code: [email or web address]  URL: [email or web address]  Date: 04/15/2019  Prepared by: Ruben Im   Exercises  Hip Flexor Stretch with Chair - 3 reps - 2 sets - 30 hold - 1x daily - 7x weekly  Prone Transversus Abdominus Contraction - 5 reps - 3 sets - 5 hold - 1x daily - 7x weekly  Child's Pose Stretch - 2 reps - 1 sets - 20 hold - 1x daily - 7x weekly  Child's Pose with Sidebending - 2 reps - 1 sets - 20 hold - 1x daily - 7x weekly  Shoulder extension with resistance - Neutral - 10 reps - 2 sets - 1x daily - 7x weekly  Standing Shoulder Flexion with Resistance - 10 reps - 2 sets - 1x daily - 7x weekly  Standing Trunk Rotation with Resistance - 10 reps - 2 sets - 1x daily - 7x weekly  Sit to Stand - 20 reps - 1 sets - 1x daily - 7x weekly  Cat-Camel - 10 reps - 1 sets - 1x daily - 7x weekly  Quadruped Thoracic Rotation with Hand on Neck - 5 reps - 2 sets - 1x daily - 7x weekly  Quadruped Thoracic Rotation - Reach Under - 5 reps - 1 sets - 1x daily - 7x weekly  Sidelying Thoracic Rotation with Open Book - 10 reps - 1 sets - 1x daily - 7x weekly  Supine Bridge - 10 reps - 2 sets - 1x daily - 7x weekly  Standing Anti-Rotation Press with Anchored Resistance - 10 reps - 1 sets - 1x daily - 7x weekly  Half-Kneeling Forward Lean - 10 reps - 1 sets - 1x daily - 7x weekly  Kneeling Double Arm Raise - 10 reps - 1 sets - 1x daily - 7x weekly  Half Kneeling Hip Flexor Stretch with Sidebend - 10 reps - 3 sets - 1x daily - 7x weekly  Half-Kneeling Shoulder Horizontal Abduction with Resistance - 10 reps - 3 sets - 1x daily - 7x weekly  Patient Education  Trigger Point Dry Needling

## 2019-04-17 ENCOUNTER — Ambulatory Visit: Payer: 59 | Admitting: Physical Therapy

## 2019-04-22 ENCOUNTER — Encounter: Payer: 59 | Admitting: Physical Therapy

## 2019-04-24 ENCOUNTER — Encounter: Payer: 59 | Admitting: Physical Therapy

## 2019-04-29 ENCOUNTER — Other Ambulatory Visit: Payer: Self-pay

## 2019-04-29 ENCOUNTER — Encounter: Payer: Self-pay | Admitting: Physical Therapy

## 2019-04-29 ENCOUNTER — Ambulatory Visit: Payer: 59 | Attending: Family Medicine | Admitting: Physical Therapy

## 2019-04-29 DIAGNOSIS — M545 Low back pain: Secondary | ICD-10-CM | POA: Insufficient documentation

## 2019-04-29 DIAGNOSIS — M6281 Muscle weakness (generalized): Secondary | ICD-10-CM | POA: Diagnosis present

## 2019-04-29 DIAGNOSIS — G8929 Other chronic pain: Secondary | ICD-10-CM | POA: Insufficient documentation

## 2019-04-29 NOTE — Therapy (Signed)
[facility] [address] [place], [place], [address] Phone: [phone]   Fax:  [phone]  Physical Therapy Treatment  Patient Details  Name: Stephanie Walton MRN: [id number] Date of Birth: 09/12/1971 Referring Provider (PT): Eunice Blase, MD   Encounter Date: 04/29/2019  PT End of Session - 04/29/19 1657    Visit Number  6    Date for PT Re-Evaluation  05/27/19    Authorization Type  UHC    PT Start Time  1600    PT Stop Time  0623    PT Time Calculation (min)  47 min    Activity Tolerance  Patient tolerated treatment well       History reviewed. No pertinent past medical history.  History reviewed. No pertinent surgical history.  There were no vitals filed for this visit.  Subjective Assessment - 04/29/19 1603    Subjective  I loved the tape! I was focused on the exercises.  I love them.  I got a gardening foam pad to stand on to do the exercises on one leg.  I'm just aware of it.  I've been doing some road biking.  I've been avoiding trimming the bushes.     How long can you sit comfortably?  1 hour, uses a standing desk    Patient Stated Goals  resolve pain and strengthen to avoid recurrence of pain/injury    Currently in Pain?  Yes    Pain Score  1     Pain Location  Back    Pain Orientation  Lower         [facility] PT Assessment - 04/29/19 0001      Observation/Other Assessments   Focus on Therapeutic Outcomes (FOTO)   26%      AROM   Overall AROM Comments  full lumbar ROM       Strength   Overall Strength Comments  LEs and core grossly 5/5;  hip abduction weakness on right 4+/5                    [facility] Adult PT Treatment/Exercise - 04/29/19 0001      Therapeutic Activites    Therapeutic Activities  Lifting;Other Therapeutic Activities    Lifting  dead lifting, pushing, pulling     Other Therapeutic Activities  hip hinge with golf club      Lumbar Exercises: Stretches   Other  Lumbar Stretch Exercise  1/2 kneeling with UE movements       Lumbar Exercises: Aerobic   Recumbent Bike  L3 x 13', PT present to discuss HEP progress and weekend activities      Lumbar Exercises: Standing   Lifting Limitations  dead lifting 10# and 25#  15x each     Row Limitations  landmines 30# 2x 10 in staggered standing     Other Standing Lumbar Exercises  windmills with foot against the wall with 5# weight pass 2x 10 each leg     Other Standing Lumbar Exercises  green band shoulder horizontal abduction single arm 10x each       Lumbar Exercises: Seated   Sit to Stand  15 reps    Sit to Stand Limitations  holding 10# weight       Kinesiotix   Facilitate Muscle   H pattern              PT Education - 04/29/19 1656    Education Details  progression of lifting for  home    Person(s) Educated  Patient    Methods  Explanation;Demonstration;Handout       PT Short Term Goals - 04/29/19 1707      PT SHORT TERM GOAL #1   Title  be independent in initial HEP    Status  Achieved      PT SHORT TERM GOAL #2   Title  Demo proper seated posture to reduce pain in sitting for work and driving.    Status  Achieved      PT SHORT TERM GOAL #3   Title  Pt will demo proper recruitment of core muscles in sitting and standing to build base on which to build global strength.    Status  Achieved        PT Long Term Goals - 04/29/19 1616      PT LONG TERM GOAL #1   Title  be independent in advanced HEP and understand how to safely progress.    Time  8    Period  Weeks    Status  On-going      PT LONG TERM GOAL #2   Title  reduce FOTO to < or = 22% limitation    Time  8    Period  Weeks    Status  Partially Met      PT LONG TERM GOAL #3   Title  Pt will achieve hip flexor flexibility to WNL bil to reduce negative influence on low back posture.    Status  Achieved      PT LONG TERM GOAL #4   Title  Pt will report at least 75% reduction in pain throughout daily tasks.     Time  8    Period  Weeks    Status  Partially Met      PT LONG TERM GOAL #5   Title  Pt will report at least 75% more confidence in back safety while performing household and recreational activities due to improved body mechanics and perception of strength and stability.    Time  8    Period  Weeks    Status  Partially Met            Plan - 04/29/19 1639    Clinical Impression Statement  The patient reports her overall progress at 70%.  She has been able to bike and kayak without an exacerbation of right low back pain.  She reports a low grade general "awareness" only.  She is able to participate in a progression of loading and lifting with muscular fatigue but no pain.  Good technique with hip hinge and dead lifting.  Progressing with rehab goals including FOTO score.  Patient would like to work on her HEP for 1 month and then follow up for a recheck.    Personal Factors and Comorbidities  Time since onset of injury/illness/exacerbation    Examination-Activity Limitations  Sit;Lift    Examination-Participation Restrictions  Yard Work;Laundry;Cleaning    Rehab Potential  Excellent    PT Frequency  2x / week    PT Duration  8 weeks    PT Treatment/Interventions  ADLs/Self Care Home Management;Cryotherapy;Electrical Stimulation;Iontophoresis '4mg'$ /ml Dexamethasone;Moist Heat;Traction;Functional mobility training;Therapeutic exercise;Neuromuscular re-education;Patient/family education;Manual techniques;Passive range of motion;Dry needling;Taping;Spinal Manipulations;Joint Manipulations    PT Next Visit Plan  follow up in 1 month;  recheck FOTO;  loaded/lifting;  progressive exercise for core strengthening    PT Home Exercise Plan  Access Code: [facility]       Patient will  benefit from skilled therapeutic intervention in order to improve the following deficits and impairments:  Increased fascial restricitons, Increased muscle spasms, Pain, Hypomobility, Impaired flexibility, Improper body  mechanics, Decreased mobility, Decreased strength  Visit Diagnosis: 1. Chronic left-sided low back pain without sciatica   2. Muscle weakness (generalized)        Problem List Patient Active Problem List   Diagnosis Date Noted  . Elevated TSH 10/22/2018  . 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 [place], [place], [address] Phone: [phone]   Fax:  [phone]  Name: Stephanie Walton MRN: [id number] Date of Birth: [birth year]

## 2019-04-29 NOTE — Patient Instructions (Signed)
     Foot on the wall, hip hinge and pass 5# weight hand to hand  10x   Dead lifting 25#   Sit to stand 10#    Keep doing resistive band, try green.     1/2 kneeling, use the band and/or arms overhead     Eat plants.    Kinesiotape (KT)  Star pattern or H pattern   Ruben Im PT [facility] [address], [place], [place] [address] Phone # [phone] Fax [phone]

## 2019-05-26 ENCOUNTER — Ambulatory Visit (INDEPENDENT_AMBULATORY_CARE_PROVIDER_SITE_OTHER): Payer: 59 | Admitting: Family Medicine

## 2019-05-26 ENCOUNTER — Encounter: Payer: Self-pay | Admitting: Family Medicine

## 2019-05-26 ENCOUNTER — Other Ambulatory Visit: Payer: Self-pay

## 2019-05-26 ENCOUNTER — Telehealth: Payer: Self-pay | Admitting: Radiology

## 2019-05-26 DIAGNOSIS — R21 Rash and other nonspecific skin eruption: Secondary | ICD-10-CM

## 2019-05-26 DIAGNOSIS — G8929 Other chronic pain: Secondary | ICD-10-CM

## 2019-05-26 DIAGNOSIS — M545 Low back pain, unspecified: Secondary | ICD-10-CM

## 2019-05-26 MED ORDER — MELOXICAM 15 MG PO TABS: 7.5000 mg | ORAL_TABLET | Freq: Every day | ORAL | 3 refills | Status: DC | PRN

## 2019-05-26 MED ORDER — MELOXICAM 15 MG PO TABS: 7.5000 mg | ORAL_TABLET | Freq: Every day | ORAL | 3 refills | Status: AC | PRN

## 2019-05-26 NOTE — Telephone Encounter (Signed)
Done

## 2019-05-26 NOTE — Telephone Encounter (Signed)
Requests medication from today be sent to [facility] on [address]. Thank you!

## 2019-05-26 NOTE — Progress Notes (Signed)
   Office Visit Note   Patient: Stephanie Walton           Date of Birth: 1971/01/21           MRN: [id number] Visit Date: 05/26/2019 Requested by: Eunice Blase, MD [address] [place],   [address] PCP: Eunice Blase, MD  Subjective: Chief Complaint  Patient presents with  . Herpes Zoster    Started on Saturday, began on arm and has spread, chest and stomach, neck, ear. originally though was chiggers from doing yard work. Blisters, popped, oozing.  . Back Pain    back spasms today    HPI: She is here with a rash.  Symptoms started 2 days ago.  It began on her left arm and then spread to her chest, both arms, stomach and behind her ears.  She has been doing a lot of yard work.  She developed blisters which.  Very itchy, no fevers or chills.              ROS:   All other systems were reviewed and are negative.  Objective: Vital Signs: There were no vitals taken for this visit.  Physical Exam:  General:  Alert and oriented, in no acute distress. Pulm:  Breathing unlabored. Psy:  Normal mood, congruent affect. Skin: She has multiple vesicular lesions on her arms, diffuse and not in a dermatomal pattern.  She has some that are crusted, none that look infected.  She has similar lesions on her abdomen and her neck.   Imaging: None today.  Assessment & Plan: 1.  Rash, suspect rhus dermatitis -Over-the-counter topical remedies.  Could treat with oral prednisone if worsens.  Follow-up as needed.     Procedures: No procedures performed  No notes on file     PMFS History: Patient Active Problem List   Diagnosis Date Noted  . Elevated TSH 10/22/2018  . 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 No pertinent past medical history.  Family History  Problem Relation Age of Onset  . Diabetes Father        Prediabetes  . Prostate cancer Father   . Cancer Father   . Heart disease Paternal Grandmother   . Prostate  cancer Paternal Grandfather   . Cancer Paternal Grandfather   . Thyroid disease Neg Hx     History reviewed. No pertinent surgical history. Social History   Occupational History  . Not on file  Tobacco Use  . Smoking status: Never Smoker  . Smokeless tobacco: Never Used  Substance and Sexual Activity  . Alcohol use: Yes    Comment: rarely, socially (1 drink per occasion)  . Drug use: Never  . Sexual activity: Not on file

## 2019-05-27 ENCOUNTER — Ambulatory Visit: Payer: 59 | Attending: Family Medicine | Admitting: Physical Therapy

## 2019-05-27 ENCOUNTER — Encounter: Payer: Self-pay | Admitting: Physical Therapy

## 2019-05-27 ENCOUNTER — Other Ambulatory Visit: Payer: Self-pay

## 2019-05-27 DIAGNOSIS — M6281 Muscle weakness (generalized): Secondary | ICD-10-CM | POA: Diagnosis present

## 2019-05-27 DIAGNOSIS — G8929 Other chronic pain: Secondary | ICD-10-CM | POA: Diagnosis present

## 2019-05-27 DIAGNOSIS — M545 Low back pain: Secondary | ICD-10-CM | POA: Insufficient documentation

## 2019-05-27 MED ORDER — PREDNISONE 10 MG PO TABS: ORAL_TABLET | ORAL | 0 refills | Status: AC

## 2019-05-27 NOTE — Patient Instructions (Signed)
   TENS UNIT  This is helpful for muscle pain and spasm.   Search and Purchase a TENS 7000 2nd edition at www.tenspros.com or www.amazon.com  (It should be less than $30)     TENS unit instructions:   Do not shower or bathe with the unit on  Turn the unit off before removing electrodes or batteries  If the electrodes lose stickiness add a drop of water to the electrodes after they are disconnected from the unit and place on plastic sheet. If you continued to have difficulty, call the TENS unit company to purchase more electrodes.  Do not apply lotion on the skin area prior to use. Make sure the skin is clean and dry as this will help prolong the life of the electrodes.  After use, always check skin for unusual red areas, rash or other skin difficulties. If there are any skin problems, does not apply electrodes to the same area.  Never remove the electrodes from the unit by pulling the wires.  Do not use the TENS unit or electrodes other than as directed.  Do not change electrode placement without consulting your therapist or physician.  Keep 2 fingers with between each electrode.   Tajh Livsey PT [facility] [address], [address] [place], [place] [address] Phone # [phone] Fax [phone] 

## 2019-05-27 NOTE — Therapy (Signed)
[facility] [address] [place], [place], [address] Phone: [phone]   Fax:  [phone]  Physical Therapy Treatment/Recertification   Patient Details  Name: Stephanie Walton MRN: [id number] Date of Birth: 09-07-1971 Referring Provider (PT): Eunice Blase, MD   Encounter Date: 05/27/2019  PT End of Session - 05/27/19 1031    Visit Number  7    Date for PT Re-Evaluation  07/08/19    Authorization Type  UHC    PT Start Time  1000    PT Stop Time  1043    PT Time Calculation (min)  43 min    Activity Tolerance  Patient limited by pain       History reviewed. No pertinent past medical history.  History reviewed. No pertinent surgical history.  There were no vitals filed for this visit.  Subjective Assessment - 05/27/19 1001    Subjective  My father died of cancer (just diagnosed at the end of May) and then my mother fell.  My back is flared up from sitting around.  Just started back on Meloxicam.  I can't get out of bed properly.  I can hardly walk into the office.    Currently in Pain?  Yes    Pain Score  8     Pain Location  Back    Pain Orientation  Right;Left;Lower         [facility] PT Assessment - 05/27/19 0001      Observation/Other Assessments   Observations  no gross postural or gait abnormalities    Focus on Therapeutic Outcomes (FOTO)   26%      AROM   Overall AROM Comments  Lumbar ROM but painful today      Strength   Overall Strength Comments  not formally reassessed today secondary to pain intensity previously LEs and core grossly 5/5;  hip abduction weakness on right 4+/5                    [facility] Adult PT Treatment/Exercise - 05/27/19 0001      Lumbar Exercises: Aerobic   Recumbent Bike  L1 5 min while discussing status       Lumbar Exercises: Seated   Other Seated Lumbar Exercises  discussion of walking program , low level mobility for 1 week       Moist Heat Therapy   Number Minutes Moist Heat  15 Minutes    Moist Heat Location  Lumbar Spine      Electrical Stimulation   Electrical Stimulation Location  bil lumbar    Electrical Stimulation Action  IFC    Electrical Stimulation Parameters  5 ma 15 min     Electrical Stimulation Goals  Pain      Manual Therapy   Myofascial Release  Addaday instrument assisted to bil lumbar paraspinals, lats and gluteals              PT Education - 05/27/19 1032    Education Details  home TENS info    Person(s) Educated  Patient    Methods  Explanation;Handout    Comprehension  Verbalized understanding       PT Short Term Goals - 05/27/19 1110      PT SHORT TERM GOAL #1   Title  be independent in initial HEP    Status  Achieved      PT SHORT TERM GOAL #2   Title  Demo proper seated posture to reduce pain in sitting  for work and driving.    Status  Achieved      PT SHORT TERM GOAL #3   Title  Pt will demo proper recruitment of core muscles in sitting and standing to build base on which to build global strength.    Status  Achieved        PT Long Term Goals - 05/27/19 1112      PT LONG TERM GOAL #1   Title  be independent in advanced HEP and understand how to safely progress.    Time  6    Period  Weeks    Status  New    Target Date  07/08/19      PT LONG TERM GOAL #2   Title  reduce FOTO to < or = 22% limitation    Time  6    Period  Weeks    Status  On-going      PT LONG TERM GOAL #3   Title  Pt will achieve hip flexor flexibility to WNL bil to reduce negative influence on low back posture.    Status  Achieved      PT LONG TERM GOAL #4   Title  Pt will report at least 75% reduction in pain throughout daily tasks.    Time  6    Period  Weeks    Status  Partially Met      PT LONG TERM GOAL #5   Title  Pt will report at least 75% more confidence in back safety while performing household and recreational activities due to improved body mechanics and perception of strength and  stability.    Time  8    Period  Weeks    Status  Partially Met            Plan - 05/27/19 1033    Clinical Impression Statement  The patient returns after the death of her father and her mother's fall with reports of severe central low back pain.  Prior to this, the patient was doing very well and peforming more advanced exercises including dead lifting and single leg dynamic core strengthening with minimal to no pain.    Treatment focus on pain control  treatments and patient education on strategies for home management.  Good response to instrument assisted manual therapy and ES/heat.  She would benefit from re-initiated treatment at a frequency of 1x/week for 6 weeks in response to this recent exacerbation.    Personal Factors and Comorbidities  Time since onset of injury/illness/exacerbation    Examination-Activity Limitations  Sit;Lift    Examination-Participation Restrictions  Yard Work;Laundry;Cleaning    Rehab Potential  Excellent    PT Frequency  1x / week    PT Duration  6 weeks    PT Treatment/Interventions  ADLs/Self Care Home Management;Cryotherapy;Electrical Stimulation;Iontophoresis '4mg'$ /ml Dexamethasone;Moist Heat;Traction;Functional mobility training;Therapeutic exercise;Neuromuscular re-education;Patient/family education;Manual techniques;Passive range of motion;Dry needling;Taping;Spinal Manipulations;Joint Manipulations    PT Next Visit Plan  1/xweek;  modalities and manual as needed;  resume core strengthening as tolerated    PT Home Exercise Plan  Access Code: [email or web address]       Patient will benefit from skilled therapeutic intervention in order to improve the following deficits and impairments:  Increased fascial restricitons, Increased muscle spasms, Pain, Hypomobility, Impaired flexibility, Improper body mechanics, Decreased mobility, Decreased strength  Visit Diagnosis: Chronic left-sided low back pain without sciatica - Plan: PT plan of care cert/re-cert  Muscle  weakness (generalized) - Plan: PT plan of care cert/re-cert  Problem List Patient Active Problem List   Diagnosis Date Noted  . Elevated TSH 10/22/2018  . 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 [place], [place], [address] Phone: [phone]   Fax:  [phone]  Name: Stephanie Walton MRN: [id number] Date of Birth: 03/08/1971

## 2019-05-29 NOTE — Addendum Note (Signed)
Addended by: Hortencia Pilar on: 05/29/2019 12:33 PM   Modules accepted: Orders

## 2019-06-03 NOTE — Addendum Note (Signed)
Addended by: Hortencia Pilar on: 06/03/2019 07:58 AM   Modules accepted: Orders

## 2019-06-10 ENCOUNTER — Telehealth: Payer: Self-pay | Admitting: Family Medicine

## 2019-06-10 ENCOUNTER — Ambulatory Visit: Payer: 59 | Admitting: Physical Therapy

## 2019-06-10 ENCOUNTER — Other Ambulatory Visit: Payer: Self-pay

## 2019-06-10 ENCOUNTER — Encounter: Payer: Self-pay | Admitting: Physical Therapy

## 2019-06-10 DIAGNOSIS — M545 Low back pain: Secondary | ICD-10-CM | POA: Diagnosis not present

## 2019-06-10 DIAGNOSIS — G8929 Other chronic pain: Secondary | ICD-10-CM

## 2019-06-10 DIAGNOSIS — M6281 Muscle weakness (generalized): Secondary | ICD-10-CM

## 2019-06-10 NOTE — Patient Instructions (Signed)
Access Code: [email or web address]  URL: [email or web address]  Date: 06/10/2019  Prepared by: Venetia Night Calea Hribar   Exercises Supine LE Neural Mobilization - 40 reps - 1 sets - 3x daily - 7x weekly  Do not sit > 10 min without breaking up seated posture with at least a few sit to stands or walking/stretching as time allows at work. Move through the stretches you are already doing, allowing pain to be your guide.

## 2019-06-10 NOTE — Therapy (Signed)
[facility] [address], [place], [place], [address] Phone: [phone]   Fax:  [phone]  Physical Therapy Treatment  Patient Details  Name: Stephanie Walton MRN: [id number] Date of Birth: [birth year] Referring Provider (PT): Eunice Blase, MD   Encounter Date: 06/10/2019  PT End of Session - 06/10/19 6239    Visit Number  8    Date for PT Re-Evaluation  07/08/19    Authorization Type  UHC    PT Start Time  2151    PT Stop Time  5826    PT Time Calculation (min)  50 min    Activity Tolerance  Patient tolerated treatment well;No increased pain;Patient limited by pain    Behavior During Therapy  [facility] for tasks assessed/performed       History reviewed. No pertinent past medical history.  History reviewed. No pertinent surgical history.  There were no vitals filed for this visit.  Subjective Assessment - 06/10/19 1615    Subjective  I can't sit, drive.  I've been sleeping a ton the past two weeks.  I may get an MRI.    Pertinent History  recurring back pain    Limitations  Sitting    How long can you sit comfortably?  30 min, difficulty to transition out of sitting to standing    Patient Stated Goals  resolve pain and strengthen to avoid recurrence of pain/injury    Currently in Pain?  Yes    Pain Score  8     Pain Location  Back    Pain Orientation  Left;Lower;Mid    Pain Descriptors / Indicators  Aching    Pain Type  Acute pain   exacerbation of chronic pain   Pain Onset  1 to 4 weeks ago    Pain Frequency  Constant    Aggravating Factors   sitting    Pain Relieving Factors  heat, TENS         [facility] PT Assessment - 06/10/19 0001      Special Tests    Special Tests  Lumbar    Lumbar Tests  Slump Test;Straight Leg Raise      Slump test   Findings  Positive    Side  Left    Comment  back pain only, did not perform trunk and neck aspects, legs only      Straight Leg Raise   Findings  Positive     Side   Left    Comment  for Lt back pain only, no LE pain                   [facility] Adult PT Treatment/Exercise - 06/10/19 0001      Self-Care   Self-Care  Posture;Other Self-Care Comments    Posture  use of lumbar roll, seated wedge at work, limit sitting to 10 min at a time even with a brief break to break up seated posture      Lumbar Exercises: Stretches   Other Lumbar Stretch Exercise  supine neural flossing bil, lax ankle, knee flexion/extension      Moist Heat Therapy   Number Minutes Moist Heat  12 Minutes    Moist Heat Location  Lumbar Spine      Electrical Stimulation   Electrical Stimulation Location  lower thoracic, upper glutes    Electrical Stimulation Action  IFC    Electrical Stimulation Parameters  12 min    Electrical Stimulation Goals  Pain  Manual Therapy   Manual Therapy  Soft tissue mobilization    Soft tissue mobilization  Left lumbar and thoracic paraspinals             PT Education - 06/10/19 1713    Education Details  Access Code: [email or web address], supine neural flossing, perform HEP stretches as tolerated with pain as guide, limit sitting to 10 min with at least a brief standing break    Person(s) Educated  Patient    Methods  Explanation;Demonstration;Verbal cues;Handout    Comprehension  Verbalized understanding;Returned demonstration       PT Short Term Goals - 05/27/19 1110      PT SHORT TERM GOAL #1   Title  be independent in initial HEP    Status  Achieved      PT SHORT TERM GOAL #2   Title  Demo proper seated posture to reduce pain in sitting for work and driving.    Status  Achieved      PT SHORT TERM GOAL #3   Title  Pt will demo proper recruitment of core muscles in sitting and standing to build base on which to build global strength.    Status  Achieved        PT Long Term Goals - 05/27/19 1112      PT LONG TERM GOAL #1   Title  be independent in advanced HEP and understand how to safely progress.    Time  6     Period  Weeks    Status  New    Target Date  07/08/19      PT LONG TERM GOAL #2   Title  reduce FOTO to < or = 22% limitation    Time  6    Period  Weeks    Status  On-going      PT LONG TERM GOAL #3   Title  Pt will achieve hip flexor flexibility to WNL bil to reduce negative influence on low back posture.    Status  Achieved      PT LONG TERM GOAL #4   Title  Pt will report at least 75% reduction in pain throughout daily tasks.    Time  6    Period  Weeks    Status  Partially Met      PT LONG TERM GOAL #5   Title  Pt will report at least 75% more confidence in back safety while performing household and recreational activities due to improved body mechanics and perception of strength and stability.    Time  8    Period  Weeks    Status  Partially Met            Plan - 06/10/19 1720    Clinical Impression Statement  Pt expresses great fear and fear of movement over current episode of pain.  SLR and modified Slump (no trunk flexion, just seated knee extension) test suggest dural irritation locally in lumbar spine, with symptoms only in lumbar region with these tests.  She does not have LE pain and is able to perform full rounded squat stretching, but when adds neck flexion feels it in her lumbar spine, suggesting end range dural irritation.  PT explained that movement is good with pain being her guide at this time.  Continue use of TENS and gentle aspects of past HEP since Pt has found relief with this.  PT suggested limiting sitting to 10 min with brief change of position to ease that transition  and unweight lumbar spine.  PT instructed Pt in supine neural flossing today which Pt has good response to in clinic.  She will continue to benefit from skilled PT with careful monitoring of neural tension and encouragement of movement within pain boundary.  Continue modalities as needed.    PT Frequency  1x / week    PT Duration  6 weeks    PT Treatment/Interventions  ADLs/Self Care  Home Management;Cryotherapy;Electrical Stimulation;Iontophoresis 91m/ml Dexamethasone;Moist Heat;Traction;Functional mobility training;Therapeutic exercise;Neuromuscular re-education;Patient/family education;Manual techniques;Passive range of motion;Dry needling;Taping;Spinal Manipulations;Joint Manipulations    PT Next Visit Plan  heat/stim prone, f/u on supine neural glides and compliance with limiting sitting at work, resume gentle core and spine ROM as tol, consider taping?    PT Home Exercise Plan  Access Code: [email or web address]    Consulted and Agree with Plan of Care  Patient       Patient will benefit from skilled therapeutic intervention in order to improve the following deficits and impairments:     Visit Diagnosis: Chronic left-sided low back pain without sciatica  Muscle weakness (generalized)     Problem List Patient Active Problem List   Diagnosis Date Noted  . Elevated TSH 10/22/2018  . 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 [place] [address]Phone: [phone]  Fax:  [phone] Name: Stephanie LarssonMRN: [id number] of Birth: 106-28-1972

## 2019-06-10 NOTE — Telephone Encounter (Signed)
Stephanie Walton has been to seven physical therapy visits from July 7 through September 1 for treatment of her latest low back pain flare-up.  We have been communicating through MyChart about her lack of progress.  She has tried NSAIDs and oral prednisone, and still is not improving.  I first saw her for this flare-up on March 19, 2019.  Insurance has denied MRI scan request due to lack of adequate conservative management.  It is my opinion that she has been through more than adequate conservative management and has failed to improve, so MRI is warranted.

## 2019-06-13 ENCOUNTER — Encounter: Payer: Self-pay | Admitting: Family Medicine

## 2019-06-14 ENCOUNTER — Encounter: Payer: Self-pay | Admitting: Family Medicine

## 2019-06-18 ENCOUNTER — Ambulatory Visit: Payer: 59 | Admitting: Allergy

## 2019-06-19 ENCOUNTER — Encounter: Payer: Self-pay | Admitting: Physical Therapy

## 2019-06-19 ENCOUNTER — Other Ambulatory Visit: Payer: Self-pay

## 2019-06-19 ENCOUNTER — Ambulatory Visit: Payer: 59 | Admitting: Physical Therapy

## 2019-06-19 DIAGNOSIS — G8929 Other chronic pain: Secondary | ICD-10-CM

## 2019-06-19 DIAGNOSIS — M545 Low back pain, unspecified: Secondary | ICD-10-CM

## 2019-06-19 DIAGNOSIS — M6281 Muscle weakness (generalized): Secondary | ICD-10-CM

## 2019-06-19 NOTE — Therapy (Signed)
[facility] [address] [place], [place], [address] Phone: [phone]   Fax:  [phone]  Physical Therapy Treatment  Patient Details  Name: Stephanie Walton MRN: [id number] Date of Birth: Nov 24, 1970 Referring Provider (PT): Eunice Blase, MD   Encounter Date: 06/19/2019  PT End of Session - 06/19/19 1627    Visit Number  9    Date for PT Re-Evaluation  07/08/19    Authorization Type  UHC    PT Start Time  6962    PT Stop Time  9528    PT Time Calculation (min)  44 min    Activity Tolerance  Patient tolerated treatment well       History reviewed. No pertinent past medical history.  History reviewed. No pertinent surgical history.  There were no vitals filed for this visit.  Subjective Assessment - 06/19/19 1533    Subjective  I got my Vari-desk today.   I'm doing a little better.  I can't drive myself though, my husband has to drive me.  I sit reclined back.   It hurts to sit with my legs out straight.  I drive with 2 feet instead of 1.    50% of the time walking is OK. Averaging 4,000-7,000 steps a day.  I have an MRI scheduled for 10/1.    Currently in Pain?  Yes    Pain Score  2     Pain Location  Back    Pain Orientation  Lower    Aggravating Factors   sitting ;  bending;  lifting    Pain Relieving Factors  standing; lying down                       [facility] Adult PT Treatment/Exercise - 06/19/19 0001      Therapeutic Activites    Other Therapeutic Activities  discussion of using a lumbar roll in the car       Neuro Re-ed    Neuro Re-ed Details   pain education;  anatomy and physiology related to trial of extension bias;  discussion of neural tension and why seated in the car with both legs extended may be painful;  discussion of continued walking program,  eating plants and limit sugar      Lumbar Exercises: Stretches   Press Ups  10 reps    Press Ups Limitations  able to extend more  with exhale;  cues to relax gluteals     Other Lumbar Stretch Exercise  recommend holding flexion bias ex's just for the weekend while she does extension trial       Lumbar Exercises: Aerobic   UBE (Upper Arm Bike)  standing 5 min L1 while discussing status      Lumbar Exercises: Standing   Other Standing Lumbar Exercises  standing extension over the table 3x 10    feels better   Other Standing Lumbar Exercises  golfer's lift with picking things up 3x                PT Short Term Goals - 05/27/19 1110      PT SHORT TERM GOAL #1   Title  be independent in initial HEP    Status  Achieved      PT SHORT TERM GOAL #2   Title  Demo proper seated posture to reduce pain in sitting for work and driving.    Status  Achieved      PT SHORT  TERM GOAL #3   Title  Pt will demo proper recruitment of core muscles in sitting and standing to build base on which to build global strength.    Status  Achieved        PT Long Term Goals - 05/27/19 1112      PT LONG TERM GOAL #1   Title  be independent in advanced HEP and understand how to safely progress.    Time  6    Period  Weeks    Status  New    Target Date  07/08/19      PT LONG TERM GOAL #2   Title  reduce FOTO to < or = 22% limitation    Time  6    Period  Weeks    Status  On-going      PT LONG TERM GOAL #3   Title  Pt will achieve hip flexor flexibility to WNL bil to reduce negative influence on low back posture.    Status  Achieved      PT LONG TERM GOAL #4   Title  Pt will report at least 75% reduction in pain throughout daily tasks.    Time  6    Period  Weeks    Status  Partially Met      PT LONG TERM GOAL #5   Title  Pt will report at least 75% more confidence in back safety while performing household and recreational activities due to improved body mechanics and perception of strength and stability.    Time  8    Period  Weeks    Status  Partially Met            Plan - 06/19/19 1629    Clinical  Impression Statement  Based on aggravating and pain relieving factors, initiated trial of extension biased exercises.  Initially she is fearful of extension ROM but as she continues with movement, especially standing extension, she reports pain relief and she is closer to endrange of motion.  Continued discussion of neural tension which may be the cause of her symptom irritability with driving (she drives with 2 feet instead of 1) with both legs extended out.  She reports she uses her TENS regularly which helps.  For the next 4 days, she will perform extension ex's every 2 hours, limit sitting and bending, continue walking, and standing/supine core strengthening in neutral spine is OK.  Will reassess status next visit to determine next progression.    Examination-Participation Restrictions  Yard Work;Laundry;Cleaning    Stability/Clinical Decision Making  Stable/Uncomplicated    Rehab Potential  Excellent    PT Frequency  1x / week    PT Duration  6 weeks    PT Treatment/Interventions  ADLs/Self Care Home Management;Cryotherapy;Electrical Stimulation;Iontophoresis '4mg'$ /ml Dexamethasone;Moist Heat;Traction;Functional mobility training;Therapeutic exercise;Neuromuscular re-education;Patient/family education;Manual techniques;Passive range of motion;Dry needling;Taping;Spinal Manipulations;Joint Manipulations    PT Next Visit Plan  assess response to prone press ups and standing extensions every 2 hours and hold on flexion stretches;  f/u on supine neural glides and compliance with limiting sitting at work,  gentle core and spine ROM as tol, consider taping?    PT Home Exercise Plan  Access Code: [email or web address]       Patient will benefit from skilled therapeutic intervention in order to improve the following deficits and impairments:  Increased fascial restricitons, Increased muscle spasms, Pain, Hypomobility, Impaired flexibility, Improper body mechanics, Decreased mobility, Decreased strength  Visit  Diagnosis: Chronic left-sided low back pain without  sciatica  Muscle weakness (generalized)     Problem List Patient Active Problem List   Diagnosis Date Noted  . Elevated TSH 10/22/2018  . 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 [place], [place], [address] Phone: [phone]   Fax:  [phone]  Name: Stephanie Walton MRN: [id number] Date of Birth: 08-May-1971

## 2019-06-24 ENCOUNTER — Encounter: Payer: Self-pay | Admitting: Physical Therapy

## 2019-06-24 ENCOUNTER — Ambulatory Visit: Payer: 59 | Admitting: Physical Therapy

## 2019-06-24 ENCOUNTER — Other Ambulatory Visit: Payer: Self-pay

## 2019-06-24 DIAGNOSIS — M545 Low back pain: Secondary | ICD-10-CM | POA: Diagnosis not present

## 2019-06-24 DIAGNOSIS — G8929 Other chronic pain: Secondary | ICD-10-CM

## 2019-06-24 DIAGNOSIS — M6281 Muscle weakness (generalized): Secondary | ICD-10-CM

## 2019-06-24 NOTE — Therapy (Signed)
[facility] [address] [place], [place], [address] Phone: [phone]   Fax:  [phone]  Physical Therapy Treatment  Patient Details  Name: Stephanie Walton MRN: [id number] Date of Birth: [birth year] Referring Provider (PT): Lavada Mesi, MD   Encounter Date: 06/24/2019  PT End of Session - 06/24/19 1710    Visit Number  10    Date for PT Re-Evaluation  07/08/19    Authorization Type  UHC    PT Start Time  1611    PT Stop Time  1705   TENS and heat end of session   PT Time Calculation (min)  54 min    Activity Tolerance  Patient tolerated treatment well    Behavior During Therapy  [facility] for tasks assessed/performed       History reviewed. No pertinent past medical history.  History reviewed. No pertinent surgical history.  There were no vitals filed for this visit.  Subjective Assessment - 06/24/19 1614    Subjective  I am better but not completely better.  Worked from home last Fri so I didn't have to sit.  I've been very compliant tracking "not sitting" and doing my extension exercises.  I haven't driven but once which was "pure torture."  MRI is this week.    Pertinent History  recurring back pain    Limitations  Sitting;Other (comment)   bending   How long can you sit comfortably?  30 min, difficulty to transition out of sitting to standing    How long can you stand comfortably?  unlimited    How long can you walk comfortably?  unlimited    Patient Stated Goals  resolve pain and strengthen to avoid recurrence of pain/injury    Currently in Pain?  Yes    Pain Score  2    with restricted activity/postures   Pain Location  Back    Pain Orientation  Lower    Pain Descriptors / Indicators  Aching    Pain Type  Acute pain    Pain Onset  1 to 4 weeks ago    Pain Frequency  Intermittent    Aggravating Factors   sitting, bending, lifting    Pain Relieving Factors  standing, extension exercises    Effect of  Pain on Daily Activities  sitting at work - using Veri-desk now         [facility] PT Assessment - 06/24/19 0001      Assessment   Medical Diagnosis  M54.5,G89.29 (ICD-10-CM) - Chronic left-sided low back pain without sciatica    Referring Provider (PT)  Hilts, Casimiro Needle, MD    Onset Date/Surgical Date  --   approx 3 years initial injury, recurrs every year   Hand Dominance  Right    Next MD Visit  no    Prior Therapy  no                   [facility] Adult PT Treatment/Exercise - 06/24/19 0001      Neuro Re-ed    Neuro Re-ed Details   DLS on foam with bil UE lateral and alt Rt/Lt forward arm raises to 90, SLS with 1 UE support on foam 1x30 sec each      Exercises   Exercises  Lumbar;Knee/Hip;Shoulder      Lumbar Exercises: Aerobic   UBE (Upper Arm Bike)  standing 6', 3 fwd/bwd each, PT present to discuss progress      Lumbar Exercises: Standing  Other Standing Lumbar Exercises  standing extension over the table 10x 5   feels better   Other Standing Lumbar Exercises  rockerboard balance PF/DF intermittent UE support x 2'      Knee/Hip Exercises: Stretches   Hip Flexor Stretch  Left    Hip Flexor Stretch Limitations  trial in [employer] position with opp left straight, Pt with full test position without stretch, d/c'd due to no need to stretch hip flexors      Knee/Hip Exercises: Standing   Hip Extension  Stengthening;2 sets;5 reps;Knee straight    Forward Step Up  Both;1 set;15 reps;Hand Hold: 2;Other (comment)    Forward Step Up Limitations  with opp leg extension      Shoulder Exercises: Standing   Flexion  Strengthening;Theraband;20 reps    Theraband Level (Shoulder Flexion)  Level 2 (Red)    Flexion Limitations  for deep core activation in neutral standing posture    Extension  Strengthening;Both;Theraband;20 reps    Theraband Level (Shoulder Extension)  Level 2 (Red)    Extension Limitations  for deep core activation in neutral standing posture      Modalities    Modalities  Electrical Stimulation;Moist Heat      Moist Heat Therapy   Number Minutes Moist Heat  10 Minutes    Moist Heat Location  Lumbar Spine      Electrical Stimulation   Electrical Stimulation Location  lumbar    Electrical Stimulation Action  TENS    Electrical Stimulation Goals  Pain               PT Short Term Goals - 05/27/19 1110      PT SHORT TERM GOAL #1   Title  be independent in initial HEP    Status  Achieved      PT SHORT TERM GOAL #2   Title  Demo proper seated posture to reduce pain in sitting for work and driving.    Status  Achieved      PT SHORT TERM GOAL #3   Title  Pt will demo proper recruitment of core muscles in sitting and standing to build base on which to build global strength.    Status  Achieved        PT Long Term Goals - 06/24/19 1611      PT LONG TERM GOAL #1   Title  be independent in advanced HEP and understand how to safely progress.    Status  On-going      PT LONG TERM GOAL #2   Title  reduce FOTO to < or = 22% limitation    Baseline  deferred doing FOTO 06/24/19 due to acute episode of LBP      PT LONG TERM GOAL #3   Title  Pt will achieve hip flexor flexibility to WNL bil to reduce negative influence on low back posture.    Status  On-going      PT LONG TERM GOAL #4   Title  Pt will report at least 75% reduction in pain throughout daily tasks.    Status  On-going      PT LONG TERM GOAL #5   Title  Pt will report at least 75% more confidence in back safety while performing household and recreational activities due to improved body mechanics and perception of strength and stability.    Status  On-going            Plan - 06/24/19 1659    Clinical  Impression Statement  Pt had acute exacerbation of back pain approx 5 weeks ago.  Pt with improving and localizing LBP (Pt points to L5/S1) since reducing sitting and practicing extension protocol.  PT focused on neutral spine core activation via UE tbands and  balance challenges.  Pt able to perform step ups and hip extension exercises without pain.  Pt is still unable to drive due to pain sitting in car.  PT advised continuing plan to limit/avoid sitting and intro gentle core in standing/neutral posture at home.  MRI this week.  Pt reports relief with TENS and heat so applied end of session.  Pt will continue to benefit from skilled PT and careful assessment of response to treatment and positional preference along POC.    Rehab Potential  Excellent    PT Frequency  1x / week    PT Duration  6 weeks    PT Treatment/Interventions  ADLs/Self Care Home Management;Cryotherapy;Electrical Stimulation;Iontophoresis 4mg /ml Dexamethasone;Moist Heat;Traction;Functional mobility training;Therapeutic exercise;Neuromuscular re-education;Patient/family education;Manual techniques;Passive range of motion;Dry needling;Taping;Spinal Manipulations;Joint Manipulations    PT Next Visit Plan  f/u on response to extension and neutral spine standing stabilization, update HEP as needed, taping    PT Home Exercise Plan  Access Code: [email or web address]    Consulted and Agree with Plan of Care  Patient       Patient will benefit from skilled therapeutic intervention in order to improve the following deficits and impairments:  Increased fascial restricitons, Increased muscle spasms, Pain, Hypomobility, Impaired flexibility, Improper body mechanics, Decreased mobility, Decreased strength  Visit Diagnosis: Chronic left-sided low back pain without sciatica  Muscle weakness (generalized)     Problem List Patient Active Problem List   Diagnosis Date Noted  . Elevated TSH 10/22/2018  . 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 [place], [place], [address] Phone: [phone]   Fax:  [phone]  Name: Stephanie Walton MRN:  [id number] Date of Birth: [birth year]

## 2019-06-25 ENCOUNTER — Encounter: Payer: Self-pay | Admitting: Family Medicine

## 2019-06-26 ENCOUNTER — Other Ambulatory Visit: Payer: Self-pay

## 2019-06-26 ENCOUNTER — Ambulatory Visit
Admission: RE | Admit: 2019-06-26 | Discharge: 2019-06-26 | Disposition: A | Discharge status: Home / Self Care | Payer: 59 | Source: Ambulatory Visit | Attending: Family Medicine | Admitting: Family Medicine

## 2019-06-26 ENCOUNTER — Telehealth: Payer: Self-pay | Admitting: Family Medicine

## 2019-06-26 DIAGNOSIS — M545 Low back pain, unspecified: Secondary | ICD-10-CM

## 2019-06-26 DIAGNOSIS — G8929 Other chronic pain: Secondary | ICD-10-CM

## 2019-06-26 NOTE — Telephone Encounter (Signed)
Notified pt of MRI results.  Can continue PT; switch to chiro; try ESI.  She'll let me know what she prefers.

## 2019-07-01 ENCOUNTER — Ambulatory Visit: Payer: 59 | Attending: Family Medicine | Admitting: Physical Therapy

## 2019-07-01 ENCOUNTER — Other Ambulatory Visit: Payer: Self-pay

## 2019-07-01 ENCOUNTER — Encounter: Payer: Self-pay | Admitting: Physical Therapy

## 2019-07-01 DIAGNOSIS — M6281 Muscle weakness (generalized): Secondary | ICD-10-CM | POA: Insufficient documentation

## 2019-07-01 DIAGNOSIS — G8929 Other chronic pain: Secondary | ICD-10-CM | POA: Insufficient documentation

## 2019-07-01 DIAGNOSIS — M545 Low back pain: Secondary | ICD-10-CM | POA: Insufficient documentation

## 2019-07-01 NOTE — Therapy (Signed)
[facility] [address], [address] [place], [place], [address] Phone: [phone]   Fax:  [phone]  Physical Therapy Treatment  Patient Details  Name: Stephanie HindGail Walton MRN: [id number] Date of Birth: 26-May-1971 Referring Provider (PT): Lavada MesiHilts, Michael, MD   Encounter Date: 07/01/2019  PT End of Session - 07/01/19 1609    Visit Number  11    Date for PT Re-Evaluation  07/08/19    Authorization Type  UHC    PT Start Time  1603    PT Stop Time  1650    PT Time Calculation (min)  47 min    Activity Tolerance  Patient tolerated treatment well    Behavior During Therapy  [facility] for tasks assessed/performed       History reviewed. No pertinent past medical history.  History reviewed. No pertinent surgical history.  There were no vitals filed for this visit.  Subjective Assessment - 07/01/19 1558    Subjective  Had xray and MRI since last visit.  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  I am attempted to find self-traction positions in prone and hanging from my chin up bar without lifting my feet off floor just to unweight my back.  I am painfree but only b/c I'm limiting my positions and unwilling to try things that have hurt.  I don't want an injection. "I am very patient."    Pertinent History  recurring back pain    How long can you sit comfortably?  30 min, difficulty to transition out of sitting to standing    How long can you stand comfortably?  unlimited    How long can you walk comfortably?  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    Patient Stated Goals  resolve pain and strengthen to avoid recurrence of pain/injury    Currently in Pain?  Yes    Pain Score  1     Pain Location  Back    Pain Orientation  Lower;Mid    Pain Descriptors / Indicators  Aching    Pain Type   Acute pain    Pain Onset  1 to 4 weeks ago    Pain Frequency  Intermittent    Effect of Pain on Daily Activities  using Veri-desk now to avoid sitting                       [facility] Adult PT Treatment/Exercise - 07/01/19 0001      Exercises   Exercises  Lumbar;Knee/Hip      Lumbar Exercises: Aerobic   UBE (Upper Arm Bike)  standing 6', 3 fwd/bwd each, PT present to discuss progress      Knee/Hip Exercises: Standing   Heel Raises  Both;10 reps;Left;Right;5 reps    Heel Raises Limitations  5x single leg with UE support, both x 10    Hip Abduction  Both;10 reps;Knee straight    Abduction Limitations  red band around ankles, bil UE support    Forward Step Up  Both;1 set;15 reps;Hand Hold: 0;Step Height: 6"    Forward Step Up Limitations  with opp leg extension    Other Standing Knee Exercises  monster walks fwd/bwd x 1 pass across carpet each, red band around ankles      Shoulder Exercises: Standing   Horizontal ABduction  Strengthening;Weights;Both;10 reps  Horizontal ABduction Weight (lbs)  3    Horizontal ABduction Limitations  standing on black pad with flex/abd alternating to 90 deg, narrow BOS on black pad    Flexion  Strengthening;Both;10 reps;Weights    Shoulder Flexion Weight (lbs)  3    Flexion Limitations  standing on black pad narrow BOS, alt with abd to shoulder height    Other Standing Exercises  red tband pullforwards standing on black pad, PT cued posture alignment for sternum over pubic bone      Shoulder Exercises: Body Blade   Other Body Blade Exercises  standing on black foam pad, shoulder flexion oscillations horizontal blade at waist line, switching arms for shoulder fatigue, 2' total      Manual Therapy   Manual Therapy  Manual Traction    Manual Traction  prone bil LE traction Gr II               PT Short Term Goals - 05/27/19 1110      PT SHORT TERM GOAL #1   Title  be independent in initial HEP    Status  Achieved      PT  SHORT TERM GOAL #2   Title  Demo proper seated posture to reduce pain in sitting for work and driving.    Status  Achieved      PT SHORT TERM GOAL #3   Title  Pt will demo proper recruitment of core muscles in sitting and standing to build base on which to build global strength.    Status  Achieved        PT Long Term Goals - 07/01/19 1657      PT LONG TERM GOAL #1   Title  be independent in advanced HEP and understand how to safely progress.    Status  On-going      PT LONG TERM GOAL #4   Title  Pt will report at least 75% reduction in pain throughout daily tasks.    Baseline  can't sit in car or drive with latest acute exacerbation of pain    Status  On-going      PT LONG TERM GOAL #5   Title  Pt will report at least 75% more confidence in back safety while performing household and recreational activities due to improved body mechanics and perception of strength and stability.    Status  On-going            Plan - 07/01/19 1610    Clinical Impression Statement  Pt had MRI and xray since last visit which revealed L4/5 >L5/S1 disc dessication and central bulge at L4/5.  Pt has good pain control but this is with activity modification including avoiding sitting and driving.  She has performed brief sitting on edge of chair with erect posture with good tolerance.  She still cannot sit in car or drive.  She needs occassional tactile cueing to align sternum over breastbone during standing core exercisees.  PT performed gentle prone manual traction through bil LEs as Pt described trying to re-create this herself at home with relief.  Pt with good tolerance of standing neutral posture core challenges on black pad with overlay of UE and LE movements for strength and core cueing.  Re-eval next visit with possible taper to every 2 weeks as Pt is compliant with HEP and healing strategies.    Rehab Potential  Excellent    PT Frequency  1x / week    PT Duration  6 weeks  PT  Treatment/Interventions  ADLs/Self Care Home Management;Cryotherapy;Electrical Stimulation;Iontophoresis 4mg /ml Dexamethasone;Moist Heat;Traction;Functional mobility training;Therapeutic exercise;Neuromuscular re-education;Patient/family education;Manual techniques;Passive range of motion;Dry needling;Taping;Spinal Manipulations;Joint Manipulations    PT Next Visit Plan  re-eval next visit (Pt hopes for extension of care tapering to every other week), evaluate need to maintain flexion avoidance, continue stabilization, modalities, manual traction prone    PT Home Exercise Plan  Access Code: [email or web address]    Consulted and Agree with Plan of Care  Patient       Patient will benefit from skilled therapeutic intervention in order to improve the following deficits and impairments:     Visit Diagnosis: Chronic left-sided low back pain without sciatica  Muscle weakness (generalized)     Problem List Patient Active Problem List   Diagnosis Date Noted  . Elevated TSH 10/22/2018  . 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, [place], [place], [address] Phone: [phone]   Fax:  [phone]  Name: Stephanie Walton MRN: [id number] Date of Birth: [birth year]

## 2019-07-08 ENCOUNTER — Other Ambulatory Visit: Payer: Self-pay

## 2019-07-08 ENCOUNTER — Ambulatory Visit: Payer: 59 | Admitting: Physical Therapy

## 2019-07-08 DIAGNOSIS — M545 Low back pain, unspecified: Secondary | ICD-10-CM

## 2019-07-08 DIAGNOSIS — M6281 Muscle weakness (generalized): Secondary | ICD-10-CM

## 2019-07-08 DIAGNOSIS — G8929 Other chronic pain: Secondary | ICD-10-CM

## 2019-07-08 NOTE — Therapy (Signed)
[facility] [address], [place], [place], [address] Phone: [phone]   Fax:  [phone]  Physical Therapy Treatment  Patient Details  Name: Stephanie Walton MRN: [id number] Date of Birth: 1971/02/05 Referring Provider (PT): Eunice Blase, MD   Encounter Date: 07/08/2019  PT End of Session - 07/08/19 1718    Visit Number  12    Date for PT Re-Evaluation  09/02/19    Authorization Type  UHC    PT Start Time  5681    PT Stop Time  2751    PT Time Calculation (min)  55 min    Activity Tolerance  Patient tolerated treatment well       No past medical history on file.  No past surgical history on file.  There were no vitals filed for this visit.  Subjective Assessment - 07/08/19 1613    Subjective  My husband is still driving me and I lie back in the seat with towels in lumbar region.  I have to put on socks and shoes a different way to avoid bending over.  I'm not biking.  I walk 7,000 -10,000 steps a day.  I'm hyperaware right now even though it's not hurting as much.    Pertinent History  recurring back pain    How long can you sit comfortably?  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    Currently in Pain?  Yes    Pain Score  1     Pain Location  Back    Pain Orientation  Mid    Pain Type  Acute pain    Aggravating Factors   bending, sitting, biking    Pain Relieving Factors  TENs,         [facility] PT Assessment - 07/08/19 0001      Observation/Other Assessments   Focus on Therapeutic Outcomes (FOTO)   to be done next visit       AROM   Overall AROM Comments  full extension;  flexion not tested       Strength   Right Hip ABduction  4+/5    Left Hip ABduction  4+/5    Lumbar Flexion  4+/5    Lumbar Extension  4+/5                   [facility] Adult  PT Treatment/Exercise - 07/08/19 0001      Therapeutic Activites    Lifting  hip hinge lift method     Other Therapeutic Activities  progress walking       Lumbar Exercises: Stretches   Press Ups  10 reps    Press Ups Limitations  cues to get hands under shoulders       Lumbar Exercises: Aerobic   UBE (Upper Arm Bike)  standing 8 min forward and backward L1 while discussing status/progress       Lumbar Exercises: Standing   Other Standing Lumbar Exercises  hip hinge with golf club 10x;  sit to stand with golf club 10x    Other Standing Lumbar Exercises  dead lift 5# 12 inch box 10x       Lumbar Exercises: Supine   Ab Set  10 reps    Bent Knee Raise  10 reps    Isometric Hip Flexion  10 reps  Lumbar Exercises: Quadruped   Straight Leg Raise  5 reps    Opposite Arm/Leg Raise  Right arm/Left leg;Left arm/Right leg;5 reps    Plank  20 sec              PT Education - 07/08/19 1710    Education Details  Access Code: [email or web address] URL: [email or web address] Date: 07/08/2019 Prepared by: [name] Tierney Behl  Exercises Hip Flexor Stretch with Chair - 3 reps - 2 sets - 30 hold - 1x daily - 7x weekly Prone Transversus Abdominus Contraction - 5 reps - 3 sets - 5 hold - 1x daily - 7x weekly Shoulder extension with resistance - Neutral - 10 reps - 2 sets - 1x daily - 7x weekly Standing Shoulder Flexion with Resistance - 10 reps - 2 sets - 1x daily - 7x weekly Sit to Stand - 20 reps - 1 sets - 1x daily - 7x weekly Standing Anti-Rotation Press with Anchored Resistance - 10 reps - 1 sets - 1x daily - 7x weekly Half-Kneeling Forward Lean - 10 reps - 1 sets - 1x daily - 7x weekly Hooklying Transversus Abdominis Palpation - 10 reps - 1 sets - 1x daily - 7x weekly Kneeling Double Arm Raise - 10 reps - 1 sets - 1x daily - 7x weekly Half Kneeling Hip Flexor Stretch with Sidebend - 10 reps - 3 sets - 1x daily - 7x weekly Hooklying Isometric Hip Flexion with Opposite Arm - 10 reps - 1 sets - 1x  daily - 7x weekly Supine March - 10 reps - 1 sets - 1x daily - 7x weekly Bird Dog - 10 reps - 1 sets - 1x daily - 7x weekly Standing Hip Hinge with Dowel - 10 reps - 1 sets - 1x daily - 7x weekly Standard Plank - 5 reps - 1 sets - 20 hold - 1x daily - 7x weekly    Person(s) Educated  Patient    Methods  Explanation;Demonstration;Handout    Comprehension  Returned demonstration;Verbalized understanding       PT Short Term Goals - 07/08/19 1730      PT SHORT TERM GOAL #1   Title  be independent in initial HEP    Status  Achieved      PT SHORT TERM GOAL #2   Title  Demo proper seated posture to reduce pain in sitting for work and driving.    Status  Achieved      PT SHORT TERM GOAL #3   Title  Pt will demo proper recruitment of core muscles in sitting and standing to build base on which to build global strength.    Status  Achieved        PT Long Term Goals - 07/08/19 1730      PT LONG TERM GOAL #1   Title  be independent in advanced HEP and understand how to safely progress.    Time  8    Period  Weeks    Status  On-going    Target Date  09/02/19      PT LONG TERM GOAL #2   Title  reduce FOTO to < or = 22% limitation    Time  8    Period  Weeks    Status  On-going      PT LONG TERM GOAL #3   Title  Pt will achieve hip flexor flexibility to WNL bil to reduce negative influence on low back posture.    Time  8  Period  Weeks    Status  On-going      PT LONG TERM GOAL #4   Title  Pt will report at least 75% reduction in pain throughout daily tasks.    Time  8    Period  Weeks    Status  On-going      PT LONG TERM GOAL #5   Title  Pt will report at least 75% more confidence in back safety while performing household and recreational activities due to improved body mechanics and perception of strength and stability.    Time  8    Period  Weeks    Status  On-going            Plan - 07/08/19 1658    Clinical Impression Statement  The patient reports her pain  level is much better following a recent exaceration but she is quite fearful of reinjuring her back and is still not driving, modifying putting on socks/shoes and avoiding lifting or bending.  She is able to perform neutral spine stabilization and perform hip hinge method of bending over without pain exacerbation.  Extensive education and reassuance on increasing function within reason.  Anticipate she will be able to add in supine flexion and supine neural glides next visit.   Comprehensive review and modifications to HEP for current status level.  Recommend follow up every 1-2 weeks for 4-5 more visits total.    Examination-Participation Restrictions  Yard Work;Laundry;Cleaning    Rehab Potential  Excellent    PT Frequency  1x / week    PT Duration  8 weeks    PT Treatment/Interventions  ADLs/Self Care Home Management;Cryotherapy;Electrical Stimulation;Iontophoresis 4mg /ml Dexamethasone;Moist Heat;Traction;Functional mobility training;Therapeutic exercise;Neuromuscular re-education;Patient/family education;Manual techniques;Passive range of motion;Dry needling;Taping;Spinal Manipulations;Joint Manipulations    PT Next Visit Plan  start supine flexion and supine neural glides;  continue  core strengthening in more advanced positions;   do FOTO;  functional reactivation    PT Home Exercise Plan  Access Code: [email or web address]       Patient will benefit from skilled therapeutic intervention in order to improve the following deficits and impairments:  Increased fascial restricitons, Increased muscle spasms, Pain, Hypomobility, Impaired flexibility, Improper body mechanics, Decreased mobility, Decreased strength  Visit Diagnosis: Muscle weakness (generalized) - Plan: PT plan of care cert/re-cert  Chronic left-sided low back pain without sciatica - Plan: PT plan of care cert/re-cert     Problem List Patient Active Problem List   Diagnosis Date Noted  . Elevated TSH 10/22/2018  . Sinus tarsi syndrome and  peroneal tendinitis of right ankle 01/09/2017   01/11/2017, PT 07/08/19 5:35 PM Phone: [phone] Fax: [phone] 07/08/2019, 5:34 PM  [facility] [address], [address] [place], [place], [place] Phone: [phone]   Fax:  [phone]  Name: Liylah Najarro MRN: Bennie Hind Date of Birth: Feb 15, 1971

## 2019-07-08 NOTE — Patient Instructions (Signed)
Access Code: [email or web address]  URL: [email or web address]  Date: 07/08/2019  Prepared by: Ruben Im   Exercises  Hip Flexor Stretch with Chair - 3 reps - 2 sets - 30 hold - 1x daily - 7x weekly  Prone Transversus Abdominus Contraction - 5 reps - 3 sets - 5 hold - 1x daily - 7x weekly  Shoulder extension with resistance - Neutral - 10 reps - 2 sets - 1x daily - 7x weekly  Standing Shoulder Flexion with Resistance - 10 reps - 2 sets - 1x daily - 7x weekly  Sit to Stand - 20 reps - 1 sets - 1x daily - 7x weekly  Standing Anti-Rotation Press with Anchored Resistance - 10 reps - 1 sets - 1x daily - 7x weekly  Half-Kneeling Forward Lean - 10 reps - 1 sets - 1x daily - 7x weekly  Hooklying Transversus Abdominis Palpation - 10 reps - 1 sets - 1x daily - 7x weekly  Kneeling Double Arm Raise - 10 reps - 1 sets - 1x daily - 7x weekly  Half Kneeling Hip Flexor Stretch with Sidebend - 10 reps - 3 sets - 1x daily - 7x weekly  Hooklying Isometric Hip Flexion with Opposite Arm - 10 reps - 1 sets - 1x daily - 7x weekly  Supine March - 10 reps - 1 sets - 1x daily - 7x weekly  Bird Dog - 10 reps - 1 sets - 1x daily - 7x weekly  Standing Hip Hinge with Dowel - 10 reps - 1 sets - 1x daily - 7x weekly  Standard Plank - 5 reps - 1 sets - 20 hold - 1x daily - 7x weekly

## 2019-07-09 ENCOUNTER — Ambulatory Visit: Payer: 59 | Admitting: Allergy

## 2019-07-22 ENCOUNTER — Ambulatory Visit: Payer: 59 | Admitting: Physical Therapy

## 2019-07-22 ENCOUNTER — Other Ambulatory Visit: Payer: Self-pay

## 2019-07-22 DIAGNOSIS — M545 Low back pain, unspecified: Secondary | ICD-10-CM

## 2019-07-22 DIAGNOSIS — G8929 Other chronic pain: Secondary | ICD-10-CM

## 2019-07-22 DIAGNOSIS — M6281 Muscle weakness (generalized): Secondary | ICD-10-CM

## 2019-07-22 NOTE — Therapy (Signed)
[facility] [address], [address] [place], [place], [address] Phone: [phone]   Fax:  [phone]  Physical Therapy Treatment  Patient Details  Name: Stephanie HindGail Saiz MRN: [id number] Date of Birth: 07-06-1971 Referring Provider (PT): Lavada MesiHilts, Michael, MD   Encounter Date: 07/22/2019  PT End of Session - 07/22/19 1707    Visit Number  13    Date for PT Re-Evaluation  09/02/19    Authorization Type  UHC 23 visit limit    PT Start Time  1417    PT Stop Time  1500    PT Time Calculation (min)  43 min    Activity Tolerance  Patient tolerated treatment well       No past medical history on file.  No past surgical history on file.  There were no vitals filed for this visit.  Subjective Assessment - 07/22/19 1617    Subjective  I'm not progressing as fast as I want.  I hiked to the store.  I still can't drive my car.  It's not comfortable so I sold it today.    How long can you sit comfortably?  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    Patient Stated Goals  resolve pain and strengthen to avoid recurrence of pain/injury    Currently in Pain?  Yes    Pain Score  3     Pain Location  Back         [facility] PT Assessment - 07/22/19 0001      Observation/Other Assessments   Focus on Therapeutic Outcomes (FOTO)   40% limitation                    [facility] Adult PT Treatment/Exercise - 07/22/19 0001      Therapeutic Activites    Lifting  hip hinge lift method     Other Therapeutic Activities  lifting, flexion recovery       Neuro Re-ed    Neuro Re-ed Details   education and reassurance on recovery of function including flexion and neural mobility       Lumbar Exercises: Stretches   Other Lumbar Stretch Exercise  KTC 10x single right/left       Lumbar Exercises: Aerobic    UBE (Upper Arm Bike)  standing 4 min alternating directions       Lumbar Exercises: Standing   Other Standing Lumbar Exercises  1/2 kneel, tall kneel green band extension, horizontal abduction, overhead 5x each     Other Standing Lumbar Exercises  10# dead lifts to floor 20x       Lumbar Exercises: Supine   Bent Knee Raise  10 reps    Other Supine Lumbar Exercises  ankle DF/PF 10x right/left    Other Supine Lumbar Exercises  neural gliding 10x knee flexion/extension 10x right/left       Lumbar Exercises: Prone   Other Prone Lumbar Exercises  quadruped bird dog rows     Other Prone Lumbar Exercises  bird dog LE squares 10x each direction with arm holds              PT Education - 07/22/19 1704    Education Details  Access Code: [email or web address] dead lift with 10#;  SKTC;  supine neural glide    Person(s) Educated  Patient    Methods  Explanation;Demonstration;Handout  Comprehension  Returned demonstration;Verbalized understanding       PT Short Term Goals - 07/08/19 1730      PT SHORT TERM GOAL #1   Title  be independent in initial HEP    Status  Achieved      PT SHORT TERM GOAL #2   Title  Demo proper seated posture to reduce pain in sitting for work and driving.    Status  Achieved      PT SHORT TERM GOAL #3   Title  Pt will demo proper recruitment of core muscles in sitting and standing to build base on which to build global strength.    Status  Achieved        PT Long Term Goals - 07/08/19 1730      PT LONG TERM GOAL #1   Title  be independent in advanced HEP and understand how to safely progress.    Time  8    Period  Weeks    Status  On-going    Target Date  09/02/19      PT LONG TERM GOAL #2   Title  reduce FOTO to < or = 22% limitation    Time  8    Period  Weeks    Status  On-going      PT LONG TERM GOAL #3   Title  Pt will achieve hip flexor flexibility to WNL bil to reduce negative influence on low back posture.    Time  8    Period  Weeks     Status  On-going      PT LONG TERM GOAL #4   Title  Pt will report at least 75% reduction in pain throughout daily tasks.    Time  8    Period  Weeks    Status  On-going      PT LONG TERM GOAL #5   Title  Pt will report at least 75% more confidence in back safety while performing household and recreational activities due to improved body mechanics and perception of strength and stability.    Time  8    Period  Weeks    Status  On-going            Plan - 07/22/19 1656    Clinical Impression Statement  The patient is doing quite well following a LBP flare up in mid August; however she is fearful of any type of spinal flexion.  Initiated supine flexion and neural gliding without pain exacerbation and increasing ROM with repetition.  She is able to perform a progression of lumbo/pelvic/hip strengthening in more challenging positions without difficulty.  Minimal cues to avoid compensatory trunk rotation in quadruped.  Good carryover with hip hinge and dead lift and able to progress weight.    Examination-Participation Restrictions  Yard Work;Laundry;Cleaning    Rehab Potential  Excellent    PT Frequency  1x / week    PT Duration  8 weeks    PT Treatment/Interventions  ADLs/Self Care Home Management;Cryotherapy;Electrical Stimulation;Iontophoresis 4mg /ml Dexamethasone;Moist Heat;Traction;Functional mobility training;Therapeutic exercise;Neuromuscular re-education;Patient/family education;Manual techniques;Passive range of motion;Dry needling;Taping;Spinal Manipulations;Joint Manipulations    PT Next Visit Plan  continue  core strengthening in more advanced positions;    functional reactivation;  recovery of flexion and neural mobility    PT Home Exercise Plan  Access Code: [email or web address]       Patient will benefit from skilled therapeutic intervention in order to improve the following deficits and impairments:  Increased fascial restricitons,  Increased muscle spasms, Pain, Hypomobility, Impaired  flexibility, Improper body mechanics, Decreased mobility, Decreased strength  Visit Diagnosis: Muscle weakness (generalized)  Chronic left-sided low back pain without sciatica     Problem List Patient Active Problem List   Diagnosis Date Noted  . Elevated TSH 10/22/2018  . 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, [address] [place], [place], [address] Phone: [phone]   Fax:  [phone]  Name: Gissele Narducci MRN: [id number] Date of Birth: June 03, 1971

## 2019-07-22 NOTE — Patient Instructions (Signed)
Access Code: [email or web address]  URL: [email or web address]  Date: 07/22/2019  Prepared by: Ruben Im   Exercises  Hip Flexor Stretch with Chair - 3 reps - 2 sets - 30 hold - 1x daily - 7x weekly  Prone Transversus Abdominus Contraction - 5 reps - 3 sets - 5 hold - 1x daily - 7x weekly  Shoulder extension with resistance - Neutral - 10 reps - 2 sets - 1x daily - 7x weekly  Standing Shoulder Flexion with Resistance - 10 reps - 2 sets - 1x daily - 7x weekly  Sit to Stand - 20 reps - 1 sets - 1x daily - 7x weekly  Standing Anti-Rotation Press with Anchored Resistance - 10 reps - 1 sets - 1x daily - 7x weekly  Half-Kneeling Forward Lean - 10 reps - 1 sets - 1x daily - 7x weekly  Hooklying Transversus Abdominis Palpation - 10 reps - 1 sets - 1x daily - 7x weekly  Kneeling Double Arm Raise - 10 reps - 1 sets - 1x daily - 7x weekly  Half Kneeling Hip Flexor Stretch with Sidebend - 10 reps - 3 sets - 1x daily - 7x weekly  Hooklying Isometric Hip Flexion with Opposite Arm - 10 reps - 1 sets - 1x daily - 7x weekly  Supine March - 10 reps - 1 sets - 1x daily - 7x weekly  Bird Dog - 10 reps - 1 sets - 1x daily - 7x weekly  Standing Hip Hinge with Dowel - 10 reps - 1 sets - 1x daily - 7x weekly  Standard Plank - 5 reps - 1 sets - 20 hold - 1x daily - 7x weekly  Hooklying Single Knee to Chest Stretch - 10 reps - 1 sets - 1x daily - 7x weekly  Supine LE Neural Mobilization - 10 reps - 1 sets - 1x daily - 7x weekly  Half Dead Lift with Kettlebell - 10 reps - 1 sets - 1x daily - 7x weekly

## 2019-08-05 ENCOUNTER — Ambulatory Visit: Payer: 59 | Attending: Family Medicine | Admitting: Physical Therapy

## 2019-08-05 ENCOUNTER — Other Ambulatory Visit: Payer: Self-pay

## 2019-08-05 ENCOUNTER — Encounter: Payer: Self-pay | Admitting: Physical Therapy

## 2019-08-05 DIAGNOSIS — G8929 Other chronic pain: Secondary | ICD-10-CM

## 2019-08-05 DIAGNOSIS — M545 Low back pain: Secondary | ICD-10-CM | POA: Insufficient documentation

## 2019-08-05 DIAGNOSIS — M6281 Muscle weakness (generalized): Secondary | ICD-10-CM | POA: Insufficient documentation

## 2019-08-05 NOTE — Therapy (Signed)
[facility] [address] [place], [place], [address] Phone: [phone]   Fax:  [phone]  Physical Therapy Treatment  Patient Details  Name: Stephanie Walton MRN: [id number] Date of Birth: 1971/03/29 Referring Provider (PT): Lavada Mesi, MD   Encounter Date: 08/05/2019  PT End of Session - 08/05/19 1639    Visit Number  14    Date for PT Re-Evaluation  09/02/19    Authorization Type  [employer] 23 visit limit    PT Start Time  1617    PT Stop Time  1659    PT Time Calculation (min)  42 min    Activity Tolerance  Patient tolerated treatment well       History reviewed. No pertinent past medical history.  History reviewed. No pertinent surgical history.  There were no vitals filed for this visit.  Subjective Assessment - 08/05/19 1620    Subjective  I rode 25 miles on my bike.  Did 28,000 steps.  Did my whole ex routine.    How long can you sit comfortably?  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    Currently in Pain?  No/denies    Pain Score  0-No pain    Pain Location  Back         [facility] PT Assessment - 08/05/19 0001      Observation/Other Assessments   Focus on Therapeutic Outcomes (FOTO)   35% limitation       AROM   Lumbar Flexion  full and painless     Lumbar Extension  full and painless    Lumbar - Right Side Bend  full    Lumbar - Left Side Bend  full       Strength   Right Hip ABduction  4+/5    Left Hip ABduction  4+/5    Lumbar Flexion  4+/5    Lumbar Extension  5/5                   [facility] Adult PT Treatment/Exercise - 08/05/19 0001      Therapeutic Activites    Lifting  hip hinge lift method     Other Therapeutic Activities  lifting, flexion recovery       Lumbar Exercises: Stretches   Double Knee to Chest Stretch  5 reps    Other Lumbar Stretch Exercise  KTC 10x single  right/left       Lumbar Exercises: Aerobic   UBE (Upper Arm Bike)  standing 5 min alternating directions       Lumbar Exercises: Standing   Forward Lunge Limitations  foot on 2nd step with forward bend 10x each leg     Other Standing Lumbar Exercises  25# 10x    Other Standing Lumbar Exercises  30# bar 8x       Lumbar Exercises: Seated   Other Seated Lumbar Exercises  neural tension 10x right/left     Other Seated Lumbar Exercises  flexion reaching between feet 8x                PT Short Term Goals - 07/08/19 1730      PT SHORT TERM GOAL #1   Title  be independent in initial HEP    Status  Achieved      PT SHORT TERM GOAL #2   Title  Demo proper seated posture to reduce pain in  sitting for work and driving.    Status  Achieved      PT SHORT TERM GOAL #3   Title  Pt will demo proper recruitment of core muscles in sitting and standing to build base on which to build global strength.    Status  Achieved        PT Long Term Goals - 08/05/19 1727      PT LONG TERM GOAL #1   Title  be independent in advanced HEP and understand how to safely progress.    Time  8    Period  Weeks    Status  On-going      PT LONG TERM GOAL #2   Title  reduce FOTO to < or = 22% limitation    Time  8    Period  Weeks    Status  On-going      PT LONG TERM GOAL #3   Title  Pt will achieve hip flexor flexibility to WNL bil to reduce negative influence on low back posture.    Status  Achieved      PT LONG TERM GOAL #4   Title  Pt will report at least 75% reduction in pain throughout daily tasks.    Time  8    Period  Weeks    Status  On-going      PT LONG TERM GOAL #5   Title  Pt will report at least 75% more confidence in back safety while performing household and recreational activities due to improved body mechanics and perception of strength and stability.    Time  8    Period  Weeks    Status  On-going            Plan - 08/05/19 1711    Clinical Impression  Statement  The patient is able to progress with lumbar flexion and neural tension without production of back pain.  Improvement in FOTO score from 40% to 35 %.   She has increased functional activity (riding her bike 25 miles) but is fearful to lift boxes at work and return to swing dancing yet.  She has full and painfree ROM.  Extensive time spent on education and recovery of function.  Demonstrates good compliance with HEP.    Rehab Potential  Excellent    PT Frequency  1x / week    PT Duration  8 weeks    PT Treatment/Interventions  ADLs/Self Care Home Management;Cryotherapy;Electrical Stimulation;Iontophoresis 4mg /ml Dexamethasone;Moist Heat;Traction;Functional mobility training;Therapeutic exercise;Neuromuscular re-education;Patient/family education;Manual techniques;Passive range of motion;Dry needling;Taping;Spinal Manipulations;Joint Manipulations    PT Next Visit Plan  follow up in 1 month;  expect readiness for discharge at that time;  recheck hip MMT;  recheck FOTO;  Check remaining LTGs;     functional reactivation;  recovery of flexion and neural mobility    PT Home Exercise Plan  Access Code: [email or web address]       Patient will benefit from skilled therapeutic intervention in order to improve the following deficits and impairments:  Increased fascial restricitons, Increased muscle spasms, Pain, Hypomobility, Impaired flexibility, Improper body mechanics, Decreased mobility, Decreased strength  Visit Diagnosis: Muscle weakness (generalized)  Chronic left-sided low back pain without sciatica     Problem List Patient Active Problem List   Diagnosis Date Noted  . Elevated TSH 10/22/2018  . 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, [place], [place],  [address] Phone: [phone]   Fax:  [phone]  Name: Stephanie Walton MRN: [id number] Date of Birth: [birth year]

## 2019-08-15 ENCOUNTER — Other Ambulatory Visit: Payer: Self-pay

## 2019-08-15 DIAGNOSIS — Z20822 Contact with and (suspected) exposure to covid-19: Secondary | ICD-10-CM

## 2019-08-19 LAB — NOVEL CORONAVIRUS, NAA: SARS-CoV-2, NAA: NOT DETECTED

## 2019-08-28 ENCOUNTER — Other Ambulatory Visit: Payer: Self-pay

## 2019-08-28 ENCOUNTER — Ambulatory Visit: Payer: 59 | Attending: Family Medicine | Admitting: Physical Therapy

## 2019-08-28 ENCOUNTER — Encounter: Payer: Self-pay | Admitting: Physical Therapy

## 2019-08-28 DIAGNOSIS — M545 Low back pain, unspecified: Secondary | ICD-10-CM

## 2019-08-28 DIAGNOSIS — G8929 Other chronic pain: Secondary | ICD-10-CM | POA: Diagnosis present

## 2019-08-28 DIAGNOSIS — M6281 Muscle weakness (generalized): Secondary | ICD-10-CM | POA: Insufficient documentation

## 2019-08-28 NOTE — Therapy (Signed)
[facility] [address] [place], [place], [address] Phone: [phone]   Fax:  [phone]  Physical Therapy Treatment/Discharge Summary   Patient Details  Name: Stephanie Walton MRN: [id number] Date of Birth: 1970-10-15 Referring Provider (PT): Eunice Blase, MD   Encounter Date: 08/28/2019  PT End of Session - 08/28/19 1707    Visit Number  15    Date for PT Re-Evaluation  09/02/19    Authorization Type  [employer] 23 visit limit    PT Start Time  8182    PT Stop Time  9937    PT Time Calculation (min)  39 min    Activity Tolerance  Patient tolerated treatment well       History reviewed. No pertinent past medical history.  History reviewed. No pertinent surgical history.  There were no vitals filed for this visit.  Subjective Assessment - 08/28/19 1618    Subjective  I got my car.  I love it.  It's so comfortable.  Had it for 2 weeks.  I've been traveling to do [place] to [place] my mom.  I wanted to keep this appt so I could go out strong.  Some tightness in low back.  I'm still cautious and careful.    How long can you sit comfortably?  2 hours in the right chair    Patient Stated Goals  resolve pain and strengthen to avoid recurrence of pain/injury    Currently in Pain?  No/denies    Pain Score  0-No pain    Pain Location  Back    Pain Descriptors / Indicators  Tightness         [facility] PT Assessment - 08/28/19 0001      Observation/Other Assessments   Focus on Therapeutic Outcomes (FOTO)   29% limitation       AROM   Lumbar Flexion  full and painless    can touch toes   Lumbar Extension  full and painless    Lumbar - Right Side Bend  full    Lumbar - Left Side Bend  full       Strength   Right Hip ABduction  5-/5    Left Hip ABduction  5/5    Lumbar Flexion  5/5    Lumbar Extension  5/5                   [facility] Adult PT Treatment/Exercise - 08/28/19 0001      Therapeutic Activites    Other  Therapeutic Activities  how to put on socks and shoes      Lumbar Exercises: Aerobic   UBE (Upper Arm Bike)  standing 5 min while discussing status/progress     Lumbar Exercises: Standing   Other Standing Lumbar Exercises  comprehensive review and reorganization of HEP       Lumbar Exercises: Seated   Other Seated Lumbar Exercises  neural tension 10x right/left    with ankle pumps and circles     Lumbar Exercises: Supine   Other Supine Lumbar Exercises  partial sit ups 10x knees bent   pt requests to resume this ex as she did in the past     Lumbar Exercises: Prone   Other Prone Lumbar Exercises  plank 3x      Lumbar Exercises: Quadruped   Opposite Arm/Leg Raise  Right arm/Left leg;Left arm/Right leg             PT Education - 08/28/19 1702  Education Details  Access Code: [email or web address]  re-organization of HEP    Person(s) Educated  Patient    Methods  Explanation;Demonstration;Handout    Comprehension  Returned demonstration;Verbalized understanding       PT Short Term Goals - 08/28/19 1717      PT SHORT TERM GOAL #1   Title  be independent in initial HEP    Status  Achieved      PT SHORT TERM GOAL #2   Title  Demo proper seated posture to reduce pain in sitting for work and driving.    Status  Achieved      PT SHORT TERM GOAL #3   Title  Pt will demo proper recruitment of core muscles in sitting and standing to build base on which to build global strength.    Status  Achieved        PT Long Term Goals - 08/28/19 1656      PT LONG TERM GOAL #1   Title  be independent in advanced HEP and understand how to safely progress.    Status  Achieved      PT LONG TERM GOAL #2   Title  reduce FOTO to < or = 22% limitation    Status  Partially Met      PT LONG TERM GOAL #3   Title  Pt will achieve hip flexor flexibility to WNL bil to reduce negative influence on low back posture.    Status  Achieved      PT LONG TERM GOAL #4   Title  Pt will report at least 75%  reduction in pain throughout daily tasks.    Status  Achieved      PT LONG TERM GOAL #5   Title  Pt will report at least 75% more confidence in back safety while performing household and recreational activities due to improved body mechanics and perception of strength and stability.    Status  Achieved            Plan - 08/28/19 1648    Clinical Impression Statement  The patient has made excellent progress in return to function and pain relief.  She has full lumbar ROM in all planes.  Her lumbo/pelvic/hip core strength is grossly 5/5.  She has much improved neural mobility with no low back symptoms with seated knee extension and ankle dorsiflexion tensioning.  She is able to sit 2 hours in a good chair for work.  She is able to walk longer distances.  She is independent in a comprehensive HEP and demonstrates excellent compliance.  She self rates her overall improvement at 85-90%.  Her FOTO functional outcome score has improved to a 29% limitation.  She has met the majority of short and long term goals.  Recommend discharge from PT at this time.    PT Home Exercise Plan  Access Code: [email or web address]       Patient will benefit from skilled therapeutic intervention in order to improve the following deficits and impairments:  Increased fascial restricitons, Increased muscle spasms, Pain, Hypomobility, Impaired flexibility, Improper body mechanics, Decreased mobility, Decreased strength  Visit Diagnosis: Muscle weakness (generalized)  Chronic left-sided low back pain without sciatica    PHYSICAL THERAPY DISCHARGE SUMMARY  Visits from Start of Care: 15  Current functional level related to goals / functional outcomes: See clinical impressions above   Remaining deficits: As above   Education / Equipment: HEP  Plan: Patient agrees to discharge.  Patient goals were met. Patient is  being discharged due to meeting the stated rehab goals.  ?????         Problem List Patient Active  Problem List   Diagnosis Date Noted  . Elevated TSH 10/22/2018  . 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 [place], [place], [address] Phone: [phone]   Fax:  [phone]  Name: Stephanie Walton MRN: [id number] Date of Birth: 08-29-1971

## 2019-08-28 NOTE — Patient Instructions (Signed)
Access Code: [email or web address]  URL: [email or web address]  Date: 08/28/2019  Prepared by: Ruben Im   Exercises  Hip Flexor Stretch with Chair - 3 reps - 2 sets - 30 hold - 1x daily - 7x weekly  Sit to Stand - 20 reps - 1 sets - 1x daily - 7x weekly  Shoulder extension with resistance - Neutral - 10 reps - 2 sets - 1x daily - 7x weekly  Standing Shoulder Flexion with Resistance - 10 reps - 2 sets - 1x daily - 7x weekly  Standing Anti-Rotation Press with Anchored Resistance - 10 reps - 1 sets - 1x daily - 7x weekly  Half Dead Lift with Kettlebell - 10 reps - 1 sets - 1x daily - 7x weekly  Standing Hip Hinge with Dowel - 10 reps - 1 sets - 1x daily - 7x weekly  Half-Kneeling Forward Lean - 10 reps - 1 sets - 1x daily - 7x weekly  Kneeling Double Arm Raise - 10 reps - 1 sets - 1x daily - 7x weekly  Half Kneeling Hip Flexor Stretch with Sidebend - 10 reps - 3 sets - 1x daily - 7x weekly  Bird Dog - 10 reps - 1 sets - 1x daily - 7x weekly  Standard Plank - 5 reps - 1 sets - 20 hold - 1x daily - 7x weekly  Prone Transversus Abdominus Contraction - 5 reps - 3 sets - 5 hold - 1x daily - 7x weekly  Hooklying Transversus Abdominis Palpation - 10 reps - 1 sets - 1x daily - 7x weekly  Hooklying Isometric Hip Flexion with Opposite Arm - 10 reps - 1 sets - 1x daily - 7x weekly  Supine March - 10 reps - 1 sets - 1x daily - 7x weekly  Hooklying Single Knee to Chest Stretch - 10 reps - 1 sets - 1x daily - 7x weekly  Supine LE Neural Mobilization - 10 reps - 1 sets - 1x daily - 7x weekly

## 2019-10-20 ENCOUNTER — Encounter: Payer: Self-pay | Admitting: Family Medicine

## 2019-10-23 ENCOUNTER — Ambulatory Visit: Payer: 59 | Admitting: Family Medicine

## 2019-10-24 ENCOUNTER — Ambulatory Visit: Payer: 59 | Admitting: Family Medicine

## 2019-12-04 ENCOUNTER — Other Ambulatory Visit: Payer: Self-pay | Admitting: Family Medicine

## 2019-12-04 DIAGNOSIS — Z1231 Encounter for screening mammogram for malignant neoplasm of breast: Secondary | ICD-10-CM

## 2019-12-19 ENCOUNTER — Encounter: Payer: Self-pay | Admitting: Family Medicine

## 2019-12-19 ENCOUNTER — Other Ambulatory Visit: Payer: Self-pay

## 2019-12-19 ENCOUNTER — Ambulatory Visit (INDEPENDENT_AMBULATORY_CARE_PROVIDER_SITE_OTHER): Payer: 59 | Admitting: Family Medicine

## 2019-12-19 VITALS — BP 115/80 | HR 68 | Ht 67.75 in | Wt 136.0 lb

## 2019-12-19 DIAGNOSIS — R7989 Other specified abnormal findings of blood chemistry: Secondary | ICD-10-CM

## 2019-12-19 DIAGNOSIS — Z0000 Encounter for general adult medical examination without abnormal findings: Secondary | ICD-10-CM | POA: Diagnosis not present

## 2019-12-19 NOTE — Progress Notes (Signed)
   Office Visit Note   Patient: Stephanie Walton           Date of Birth: 03/14/1971           MRN: [id number] Visit Date: 12/19/2019 Requested by: Lavada Mesi, MD [address] [place],  [place] [address] PCP: Lavada Mesi, MD  Subjective: No chief complaint on file.   HPI: She is here for a wellness examination.  No specific concerns today.  She is not having any thyroid related symptoms.  She would like to try some nutritional supplements for a couple months and then have labs drawn at that point.  She is scheduled to have her annual GYN visit in the near future.  She went to her eye doctor recently and everything was normal.  Her significant other is doing well regarding his cancer treatments.               ROS:   All other systems were reviewed and are negative.  Objective: Vital Signs: BP 115/80   Pulse 68   Ht 5' 7.75" (1.721 m)   Wt 136 lb (61.7 kg)   BMI 20.83 kg/m   Physical Exam:  General:  Alert and oriented, in no acute distress. Pulm:  Breathing unlabored. Psy:  Normal mood, congruent affect. Skin: No suspicious lesions.  Female chaperone was present. HEENT:  [place]/AT, PERRLA, EOM Full, no nystagmus.  Funduscopic examination within normal limits.  No conjunctival erythema.  Tympanic membranes are pearly gray with normal landmarks.  External ear canals are normal.  Nasal passages are clear.  Oropharynx is clear.  No significant lymphadenopathy.  No thyromegaly or nodules.  2+ carotid pulses without bruits. CV: Regular rate and rhythm without murmurs, rubs, or gallops.  No peripheral edema.  2+ radial and posterior tibial pulses. Lungs: Clear to auscultation throughout with no wheezing or areas of consolidation. Abd: Bowel sounds are active, no hepatosplenomegaly or masses.  Soft and nontender.  No audible bruits.  No evidence of ascites.    Imaging: None today  Assessment & Plan: 1.  Wellness examination -Labs in 2 months.  She is scheduled for mammogram later  next month.      Procedures: No procedures performed  No notes on file     PMFS History: Patient Active Problem List   Diagnosis Date Noted  . Elevated TSH 10/22/2018  . 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 No pertinent past medical history.  Family History  Problem Relation Age of Onset  . Basal cell carcinoma Mother   . Diabetes Father        Prediabetes  . Prostate cancer Father   . Cancer Father   . Liver cancer Father        Cholangiocarcinoma  . Heart disease Paternal Grandmother   . Prostate cancer Paternal Grandfather   . Cancer Paternal Grandfather   . Thyroid disease Neg Hx     History reviewed. No pertinent surgical history. Social History   Occupational History  . Not on file  Tobacco Use  . Smoking status: Never Smoker  . Smokeless tobacco: Never Used  Substance and Sexual Activity  . Alcohol use: Yes    Comment: rarely, socially (1 drink per occasion)  . Drug use: Never  . Sexual activity: Not on file

## 2020-02-02 ENCOUNTER — Ambulatory Visit: Payer: 59

## 2020-02-11 ENCOUNTER — Other Ambulatory Visit: Payer: Self-pay

## 2020-02-11 ENCOUNTER — Ambulatory Visit
Admission: RE | Admit: 2020-02-11 | Discharge: 2020-02-11 | Disposition: A | Discharge status: Home / Self Care | Payer: 59 | Source: Ambulatory Visit | Attending: Family Medicine | Admitting: Family Medicine

## 2020-02-11 ENCOUNTER — Telehealth: Payer: Self-pay | Admitting: Family Medicine

## 2020-02-11 DIAGNOSIS — Z1231 Encounter for screening mammogram for malignant neoplasm of breast: Secondary | ICD-10-CM

## 2020-02-11 NOTE — Telephone Encounter (Signed)
Mammogram looks good

## 2020-02-18 ENCOUNTER — Encounter: Payer: Self-pay | Admitting: Family Medicine

## 2020-02-19 ENCOUNTER — Other Ambulatory Visit: Payer: Self-pay | Admitting: Family Medicine

## 2020-02-19 ENCOUNTER — Other Ambulatory Visit: Payer: Self-pay

## 2020-02-19 ENCOUNTER — Ambulatory Visit (INDEPENDENT_AMBULATORY_CARE_PROVIDER_SITE_OTHER): Payer: 59 | Admitting: Family Medicine

## 2020-02-19 ENCOUNTER — Encounter: Payer: Self-pay | Admitting: Family Medicine

## 2020-02-19 DIAGNOSIS — R7989 Other specified abnormal findings of blood chemistry: Secondary | ICD-10-CM

## 2020-02-19 DIAGNOSIS — Z0000 Encounter for general adult medical examination without abnormal findings: Secondary | ICD-10-CM

## 2020-02-19 NOTE — Progress Notes (Signed)
Fasting blood work drawn per Dr. Prince Rome: lipid panel, hs-CRP, CMET, CBC w/diff and thyroid panel +TSH.

## 2020-02-20 ENCOUNTER — Telehealth: Payer: Self-pay | Admitting: Family Medicine

## 2020-02-20 LAB — CBC WITH DIFFERENTIAL/PLATELET
Absolute Monocytes: 490 cells/uL (ref 200–950)
Basophils Absolute: 83 cells/uL (ref 0–200)
Basophils Relative: 1.2 %
Eosinophils Absolute: 131 cells/uL (ref 15–500)
Eosinophils Relative: 1.9 %
HCT: 42.5 % (ref 35.0–45.0)
Hemoglobin: 14.5 g/dL (ref 11.7–15.5)
Lymphs Abs: 2657 cells/uL (ref 850–3900)
MCH: 30.9 pg (ref 27.0–33.0)
MCHC: 34.1 g/dL (ref 32.0–36.0)
MCV: 90.4 fL (ref 80.0–100.0)
MPV: 11.8 fL (ref 7.5–12.5)
Monocytes Relative: 7.1 %
Neutro Abs: 3540 cells/uL (ref 1500–7800)
Neutrophils Relative %: 51.3 %
Platelets: 236 10*3/uL (ref 140–400)
RBC: 4.7 10*6/uL (ref 3.80–5.10)
RDW: 12.3 % (ref 11.0–15.0)
Total Lymphocyte: 38.5 %
WBC: 6.9 10*3/uL (ref 3.8–10.8)

## 2020-02-20 LAB — COMPREHENSIVE METABOLIC PANEL
AG Ratio: 1.9 (calc) (ref 1.0–2.5)
ALT: 12 U/L (ref 6–29)
AST: 17 U/L (ref 10–35)
Albumin: 4.6 g/dL (ref 3.6–5.1)
Alkaline phosphatase (APISO): 45 U/L (ref 31–125)
BUN: 13 mg/dL (ref 7–25)
CO2: 24 mmol/L (ref 20–32)
Calcium: 9.5 mg/dL (ref 8.6–10.2)
Chloride: 105 mmol/L (ref 98–110)
Creat: 0.86 mg/dL (ref 0.50–1.10)
Globulin: 2.4 g/dL (calc) (ref 1.9–3.7)
Glucose, Bld: 93 mg/dL (ref 65–99)
Potassium: 4.6 mmol/L (ref 3.5–5.3)
Sodium: 139 mmol/L (ref 135–146)
Total Bilirubin: 0.5 mg/dL (ref 0.2–1.2)
Total Protein: 7 g/dL (ref 6.1–8.1)

## 2020-02-20 LAB — HIGH SENSITIVITY CRP: hs-CRP: 2.3 mg/L

## 2020-02-20 LAB — THYROID PANEL WITH TSH
Free Thyroxine Index: 2.4 (ref 1.4–3.8)
T3 Uptake: 23 % (ref 22–35)
T4, Total: 10.5 ug/dL (ref 5.1–11.9)
TSH: 3.88 mIU/L

## 2020-02-20 LAB — LIPID PANEL
Cholesterol: 274 mg/dL — ABNORMAL HIGH (ref <200)
HDL: 91 mg/dL (ref >=50)
LDL Cholesterol (Calc): 162 mg/dL (calc) — ABNORMAL HIGH
Non-HDL Cholesterol (Calc): 183 mg/dL (calc) — ABNORMAL HIGH (ref <130)
Total CHOL/HDL Ratio: 3 (calc) (ref <5.0)
Triglycerides: 100 mg/dL (ref <150)

## 2020-02-20 NOTE — Telephone Encounter (Signed)
Labs are notable for the following:  Thyroid function looks better with TSH in normal range at 3.88.  CBC and metabolic panel look perfect.  Lipids are elevated with total cholesterol of 274 and LDL of 162.  HDL is fantastic at 91, and triglycerides are also excellent at 100.  Overall I believe you are very low risk for cardiovascular disease with your healthy lifestyle.  I do not feel treatment is warranted for this.  Could contemplate ordering a cardiac CT calcium score to further assess cardiac risk, or we could simply repeat in a year and possibly do a fractionated lipid profile to look at lipid particle sizes, also to further assess cardiac risk.

## 2020-05-12 ENCOUNTER — Encounter: Payer: Self-pay | Admitting: Family Medicine

## 2020-08-16 ENCOUNTER — Encounter: Payer: Self-pay | Admitting: Family Medicine

## 2021-04-02 IMAGING — MG DIGITAL SCREENING BILAT W/ TOMO W/ CAD
8 series · 9 of 24 positions shown · non-contrast
Comparison: Previous exam(s).

CLINICAL DATA: Screening.

EXAM:
DIGITAL SCREENING BILATERAL MAMMOGRAM WITH TOMO AND CAD

[R MLO synth-2D]
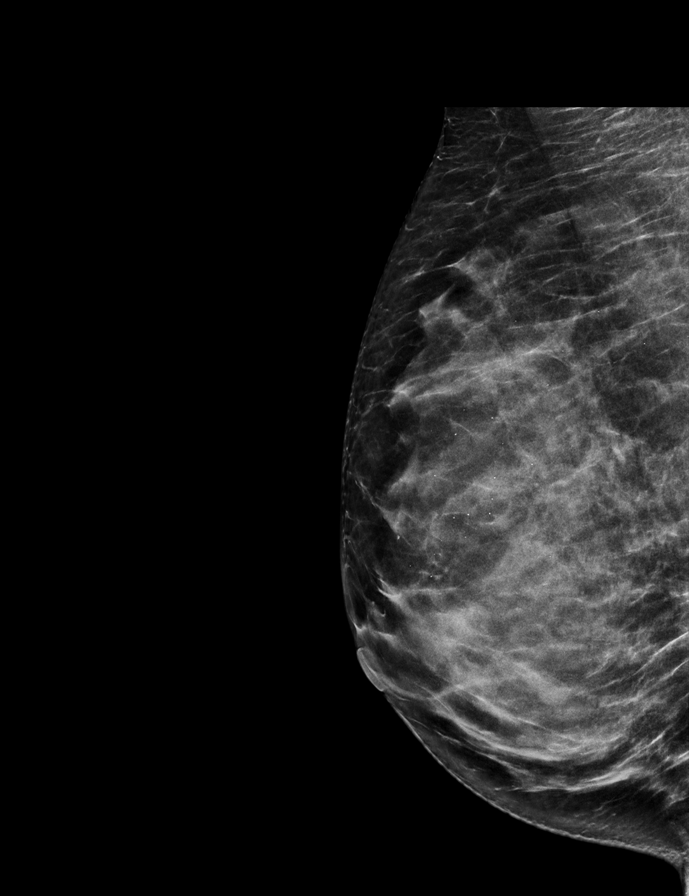

[L CC synth-2D]
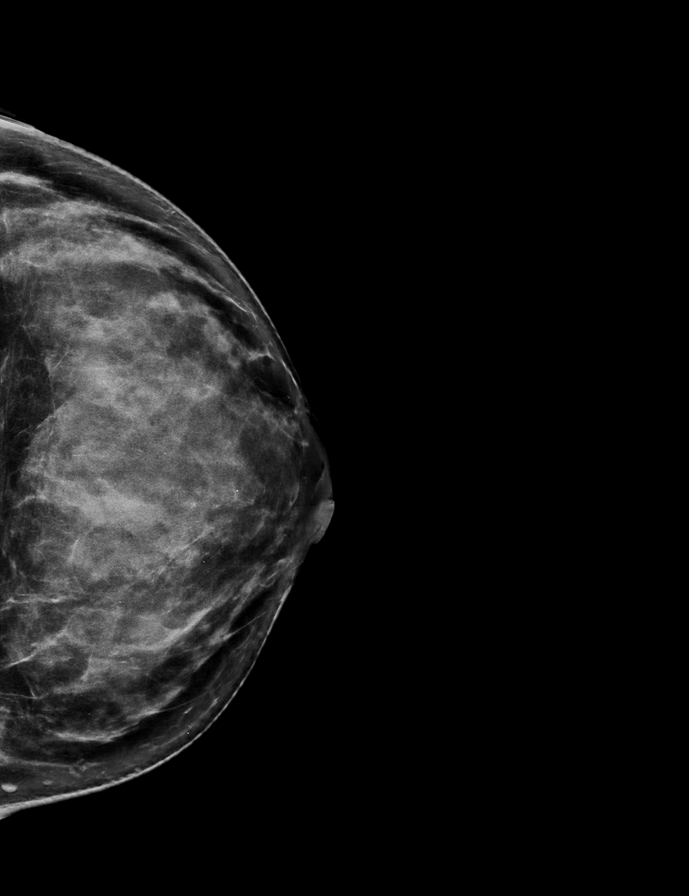

[R CC synth-2D]
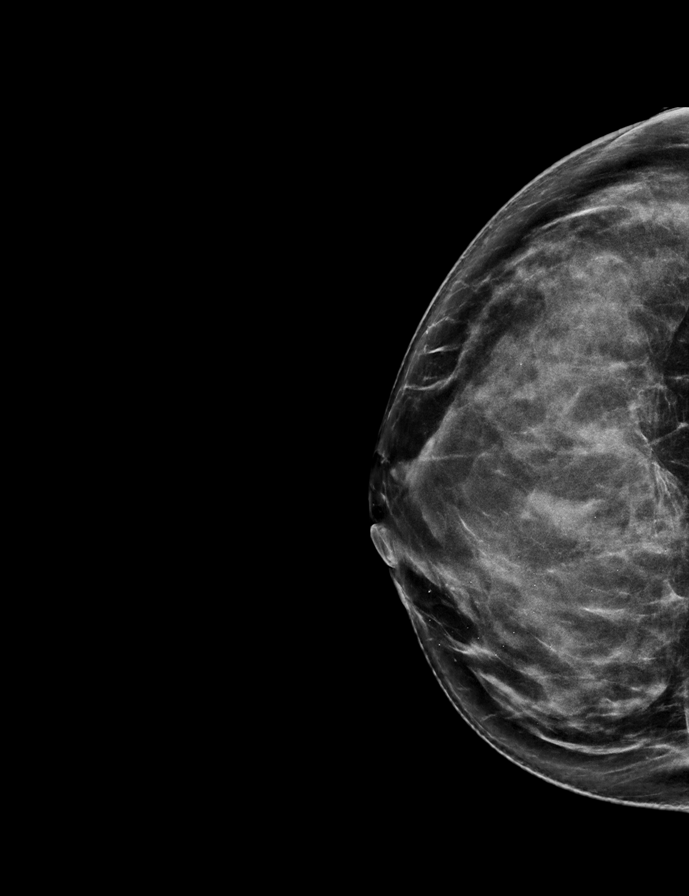

[L MLO synth-2D]
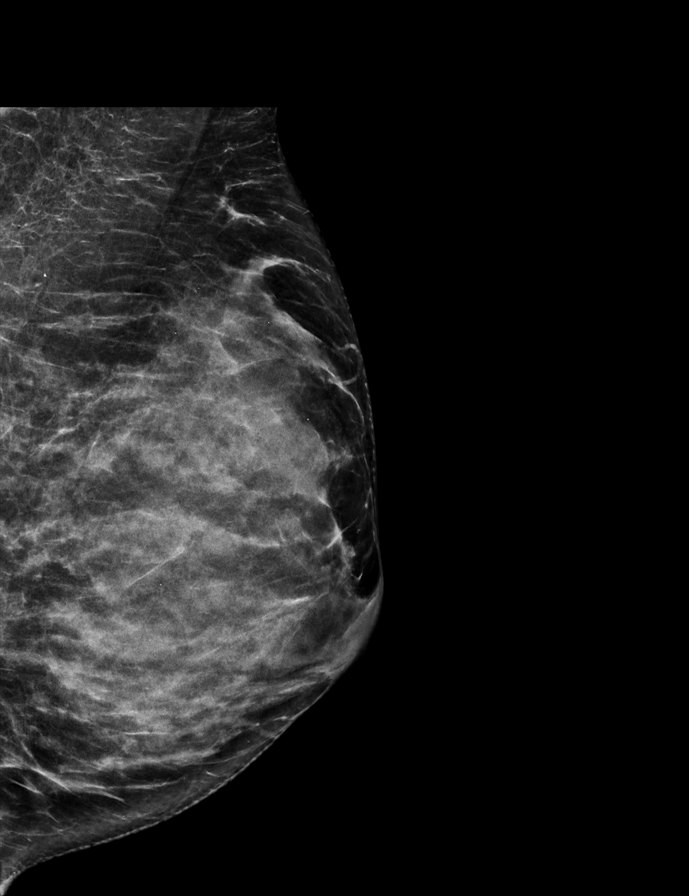

[L CC tomo · 2 of 72 frames shown]
[frame 24/72]
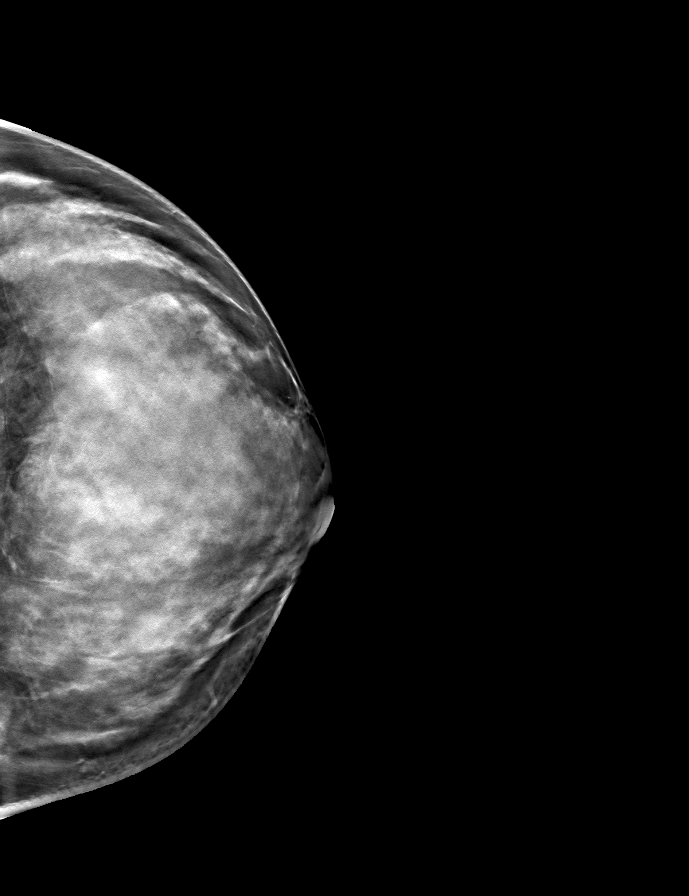
[frame 37/72]
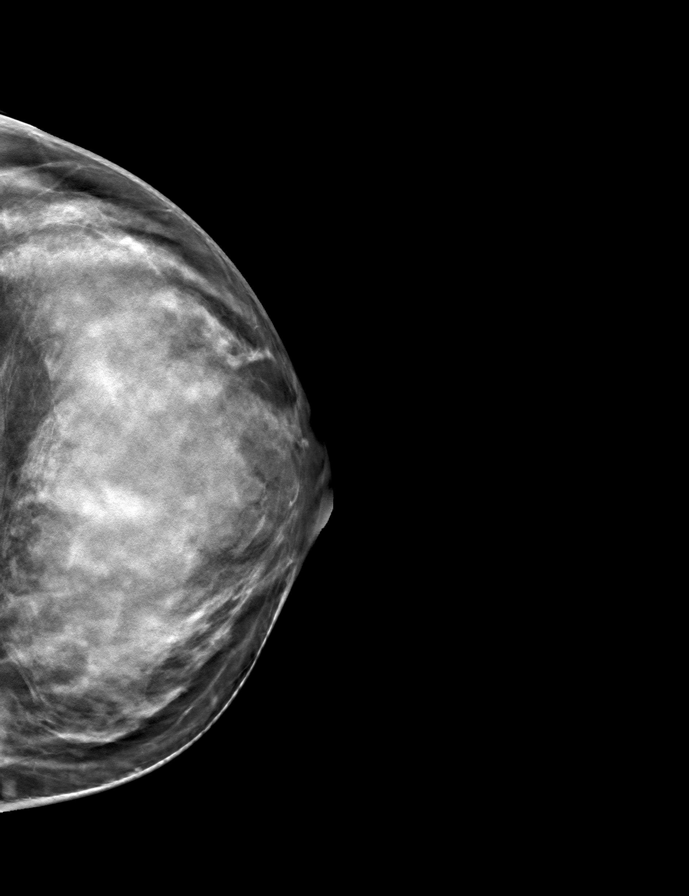

[R CC tomo · tomo slice 35/68.0]
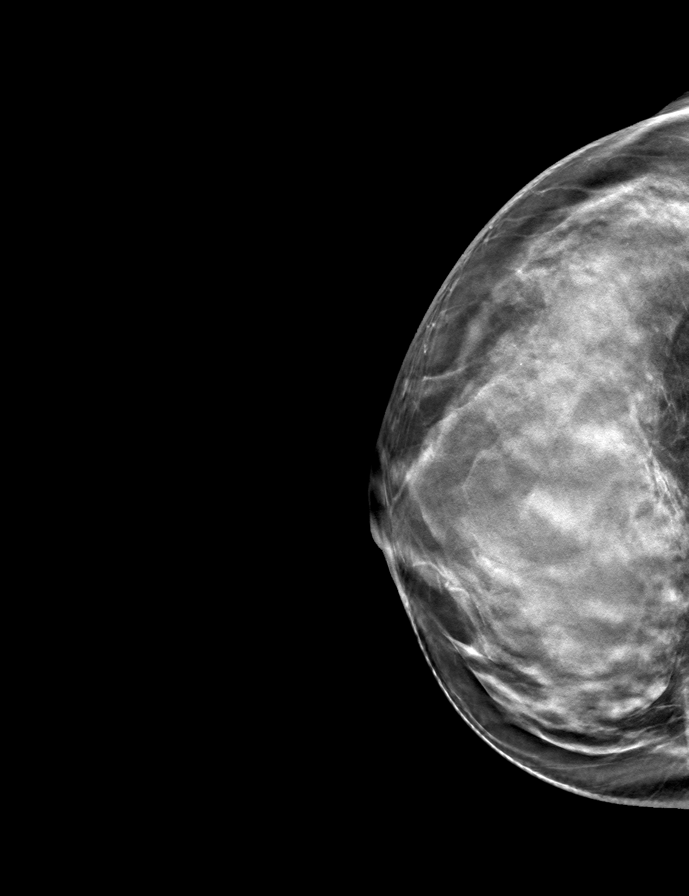

[L MLO tomo · tomo slice 31/60.0]
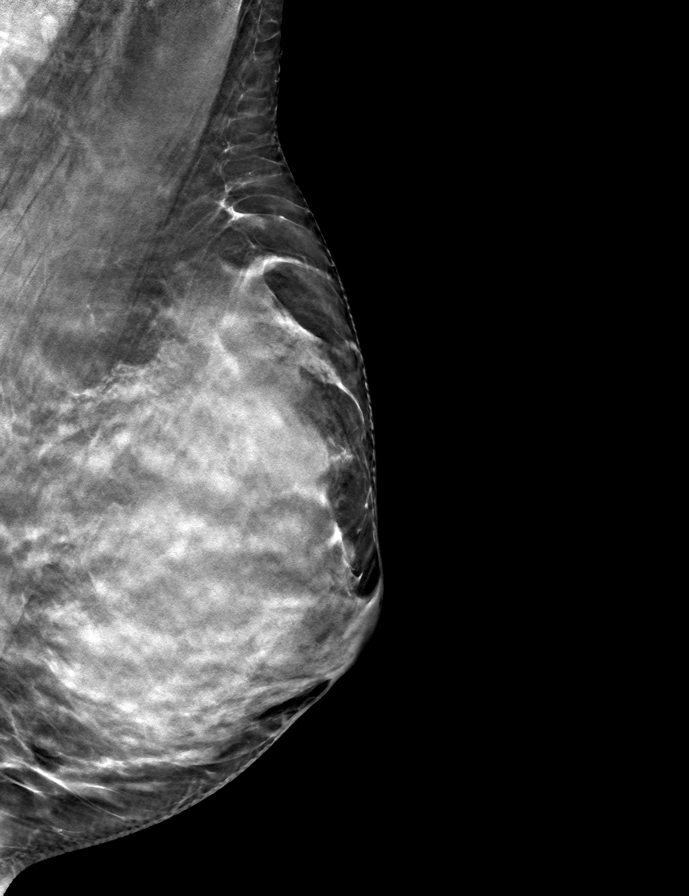

[R MLO tomo · tomo slice 30/59.0]
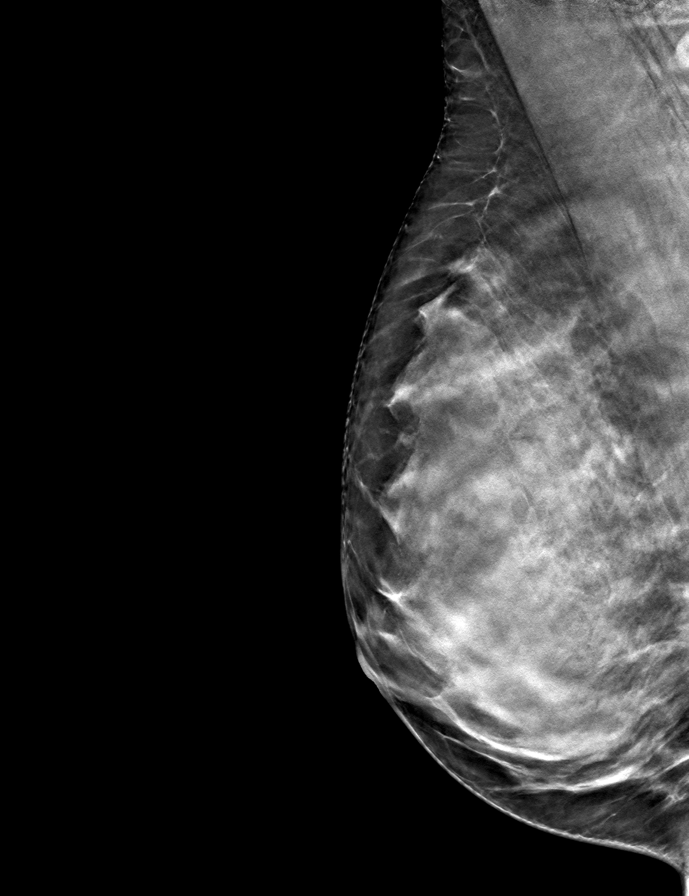

[9 of 24 positions shown; findings below may reference images not displayed]

ACR Breast Density Category d: The breast tissue is extremely dense,
which lowers the sensitivity of mammography
FINDINGS: There are no findings suspicious for malignancy. Images were
processed with CAD.
IMPRESSION: No mammographic evidence of malignancy. A result letter of this
screening mammogram will be mailed directly to the patient.

RECOMMENDATION:
Screening mammogram in one year. (Code:WO-0-ZI0)

BI-RADS CATEGORY  1: Negative.

## 2021-04-04 ENCOUNTER — Other Ambulatory Visit: Payer: Self-pay | Admitting: Family Medicine

## 2021-04-04 ENCOUNTER — Encounter: Payer: Self-pay | Admitting: Family Medicine

## 2021-04-04 MED ORDER — DOXYCYCLINE HYCLATE 100 MG PO CAPS
100.0000 mg | ORAL_CAPSULE | Freq: Two times a day (BID) | ORAL | 0 refills | Status: AC
Start: 2021-04-04 — End: ?

## 2021-04-04 NOTE — Progress Notes (Signed)
Stephanie Walton removed a tick from her neck and it measured 5 mm.  Will call in doxycycline.

## 2021-05-04 ENCOUNTER — Other Ambulatory Visit: Payer: Self-pay | Admitting: Family Medicine

## 2021-05-04 ENCOUNTER — Encounter: Payer: Self-pay | Admitting: Family Medicine

## 2021-05-04 MED ORDER — ETONOGESTREL-ETHINYL ESTRADIOL 0.12-0.015 MG/24HR VA RING
VAGINAL_RING | VAGINAL | 4 refills | Status: DC
Start: 2021-05-04 — End: 2021-05-11

## 2021-05-11 MED ORDER — ETONOGESTREL-ETHINYL ESTRADIOL 0.12-0.015 MG/24HR VA RING
VAGINAL_RING | VAGINAL | 4 refills | Status: AC
Start: 2021-05-11 — End: ?

## 2021-05-18 ENCOUNTER — Encounter (INDEPENDENT_AMBULATORY_CARE_PROVIDER_SITE_OTHER): Payer: Self-pay | Admitting: Family Medicine

## 2021-06-24 DIAGNOSIS — Z01419 Encounter for gynecological examination (general) (routine) without abnormal findings: Secondary | ICD-10-CM | POA: Diagnosis not present

## 2021-06-24 DIAGNOSIS — Z6821 Body mass index (BMI) 21.0-21.9, adult: Secondary | ICD-10-CM | POA: Diagnosis not present

## 2021-11-01 ENCOUNTER — Ambulatory Visit (INDEPENDENT_AMBULATORY_CARE_PROVIDER_SITE_OTHER): Payer: BC Managed Care – PPO | Admitting: Sports Medicine

## 2021-11-01 VITALS — BP 122/84 | Ht 67.0 in | Wt 135.0 lb

## 2021-11-01 DIAGNOSIS — R269 Unspecified abnormalities of gait and mobility: Secondary | ICD-10-CM

## 2021-11-02 NOTE — Progress Notes (Signed)
° °  Subjective:    Patient ID: Stephanie Walton, female    DOB: Oct 31, 1970, 52 y.o.   MRN: [id number]  HPI chief complaint: New orthotics  Patient is a very pleasant 51 year old female that presents today for new custom orthotics.  She has had custom orthotics in the past and used them with good results.  Her last set of orthotics were made by Dr. Karie Schwalbe in 2018.  He is no longer making custom orthotics so her primary care physician, Dr. Prince Rome, recommended that she see [place] for this.  Review of her chart shows a history of sinus tarsi syndrome and peroneal tendinitis of the right ankle.  The patient feels like she pronates when walking.  She has purchased some fairly new hiking boots and would like to have the custom orthotics created for those.  Past medical history reviewed Medications reviewed Allergies reviewed    Review of Systems As above    Objective:   Physical Exam  Well-developed, well-nourished.  No acute distress  Examination of her feet in the standing position shows fairly neutral arch but she does tend to pronate with walking.  Bunion deformity of the right great toe.  Good pulses.  Ambulating without a limp.       Assessment & Plan:   Pronation History of sinus tarsi syndrome and peroneal tendinitis likely secondary to pronation  New custom orthotics were created today.  We placed these into her hiking boots.  Her gait was neutral with orthotics in place.  She did note a little bit of increased pressure at her bunion on the right so I recommended that she rel-lace her boots to give her a little more room in the toebox.  If this is ineffective then she can return to the office for orthotic adjustment.  She may also elect to return for [place] to create custom orthotics for other shoes.  Follow-up as needed.  Patient was fitted for a : standard, cushioned, semi-rigid orthotic. The orthotic was heated and afterward the patient stood on the orthotic blank positioned on the orthotic  stand. The patient was positioned in subtalar neutral position and 10 degrees of ankle dorsiflexion in a weight bearing stance. After completion of molding, a stable base was applied to the orthotic blank. The blank was ground to a stable position for weight bearing. Size: 9 Base: Blue EVA Posting: none Additional orthotic padding: none

## 2021-11-14 ENCOUNTER — Telehealth: Payer: Self-pay | Admitting: Sports Medicine

## 2021-11-14 NOTE — Telephone Encounter (Signed)
Please note that this is not a telephone note.  Stephanie Walton returns to the office today for orthotic adjustment.  She is experiencing pressure in her forefoot, especially on the right, due to a bunion deformity.  She was feeling a little discomfort at the time of her orthotic construction a couple of weeks ago.  We will attempt to add a metatarsal pad to each of her orthotics in an attempt to help raise her transverse arch.  I think she will also need to get a hiking boot with a wider toe box.  If she continues to experience discomfort then we may consider switching her to a smart cell orthotic.

## 2022-02-02 ENCOUNTER — Encounter: Payer: BC Managed Care – PPO | Admitting: Sports Medicine

## 2022-02-09 ENCOUNTER — Ambulatory Visit (INDEPENDENT_AMBULATORY_CARE_PROVIDER_SITE_OTHER): Payer: BC Managed Care – PPO | Admitting: Sports Medicine

## 2022-02-09 VITALS — BP 122/82 | Ht 67.0 in | Wt 135.0 lb

## 2022-02-09 DIAGNOSIS — R269 Unspecified abnormalities of gait and mobility: Secondary | ICD-10-CM

## 2022-02-10 NOTE — Progress Notes (Signed)
Patient ID: Stephanie Walton, female   DOB: Nov 22, 1970, 51 y.o.   MRN: [id number]  Stephanie Walton presents today for a different pair of custom orthotics.  Initial custom orthotics were created on February 7.  This was after a referral from Dr. Prince Rome.  She found those orthotics to be uncomfortable, particularly in the toe box. We attempted to adjust this on February 20 but she continues to have problems.  Today we constructed her a pair of orthotics made with smart cell technology.  These did fit nicely in her hiking boots and she seemed to think they were more comfortable than the other orthotics.  If she finds these orthotics tight in the toebox of her hiking boots then she may have to resort to using the inserts that the hiking boots originally had.  Follow-up with Dr. Prince Rome as scheduled.  Follow-up with [place] as needed.  This note was dictated using Dragon naturally speaking software and may contain errors in syntax, spelling, or content which have not been identified prior to signing this note.

## 2022-03-29 ENCOUNTER — Other Ambulatory Visit: Payer: Self-pay | Admitting: Family Medicine

## 2022-03-29 DIAGNOSIS — Z1231 Encounter for screening mammogram for malignant neoplasm of breast: Secondary | ICD-10-CM

## 2022-07-05 ENCOUNTER — Ambulatory Visit: Payer: BC Managed Care – PPO

## 2022-07-21 ENCOUNTER — Ambulatory Visit
Admission: RE | Admit: 2022-07-21 | Discharge: 2022-07-21 | Disposition: A | Discharge status: Home / Self Care | Payer: BC Managed Care – PPO | Source: Ambulatory Visit | Attending: Family Medicine | Admitting: Family Medicine

## 2022-07-21 DIAGNOSIS — Z1231 Encounter for screening mammogram for malignant neoplasm of breast: Secondary | ICD-10-CM

## 2022-09-28 ENCOUNTER — Ambulatory Visit: Payer: BC Managed Care – PPO | Attending: Family Medicine | Admitting: Physical Therapy

## 2022-09-28 ENCOUNTER — Encounter: Payer: Self-pay | Admitting: Physical Therapy

## 2022-09-28 ENCOUNTER — Other Ambulatory Visit: Payer: Self-pay

## 2022-09-28 DIAGNOSIS — R252 Cramp and spasm: Secondary | ICD-10-CM | POA: Diagnosis not present

## 2022-09-28 DIAGNOSIS — M25551 Pain in right hip: Secondary | ICD-10-CM

## 2022-09-28 DIAGNOSIS — M79604 Pain in right leg: Secondary | ICD-10-CM | POA: Insufficient documentation

## 2022-09-28 DIAGNOSIS — R293 Abnormal posture: Secondary | ICD-10-CM

## 2022-09-28 NOTE — Therapy (Signed)
OUTPATIENT PHYSICAL THERAPY LOWER EXTREMITY EVALUATION   Patient Name: Stephanie Walton MRN: [id number] DOB:1971/09/12, 52 y.o., female Today's Date: 09/28/2022  END OF SESSION:  PT End of Session - 09/28/22 1539     Visit Number 1    Date for PT Re-Evaluation 11/23/22    Authorization Type BCBS    PT Start Time 1400    PT Stop Time 1448    PT Time Calculation (min) 48 min    Activity Tolerance Patient tolerated treatment well    Behavior During Therapy [facility] for tasks assessed/performed             History reviewed. No pertinent past medical history. History reviewed. No pertinent surgical history. 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  I can't describe it.  It might be fatigue?  It aches.  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  I feel it in the upper thigh, the outside of my hip and a line from my pelvis to the knee.  I also can get a popping in the Rt hip.   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 Yes NPRS scale: 5/10 Pain location: Rt proximal thigh anterior and lateral, a line from lateral hip to medial knee Pain orientation: Right  PAIN TYPE: aching and weak/fatigue Pain description: aching and fatigue   Aggravating factors: hiking and walking at mile 2, laying on Rt side Relieving factors: stretching   PRECAUTIONS: None  WEIGHT BEARING RESTRICTIONS: No  FALLS:  Has patient fallen in last 6 months? No  LIVING  ENVIRONMENT: Lives with: lives with their spouse Lives in: House/apartment Stairs: Yes: Internal: 16 steps; on right going up and External: 3 steps; on right going up Has following equipment at home: None  OCCUPATION: full time, international travel, standing desk and lab work  PLOF: Independent  PATIENT GOALS: figure out why Rt leg is getting tired, be able to hike 8 miles  NEXT MD VISIT: as needed  OBJECTIVE:   DIAGNOSTIC FINDINGS: n/a  PATIENT SURVEYS:  FOTO 69% goal 79%  COGNITION: Overall cognitive status: Within functional limits for tasks assessed     SENSATION: WFL   MUSCLE LENGTH: Hamstrings WNL bil Rt adductors, hip flexors tight compared to Lt + Thomas test for ITB tightness bil  POSTURE: pelvic asymmetry present: Rt inflare   PALPATION: Rt psoas, adductors, glut min, glut med, piriformis tender to palpation Significant tone in Rt adductors Atrophy noted in Rt glut min, med, max compared to Lt  LOWER EXTREMITY ROM: WNL bil   LOWER EXTREMITY MMT:  MMT Right eval Left eval  Hip flexion 4+ 5  Hip extension 4 5  Hip abduction 4 4+  Hip adduction 4+ 5  Hip internal rotation    Hip external rotation 4 4+  Knee flexion 5 5  Knee extension 5 5  Ankle dorsiflexion    Ankle plantarflexion  Ankle inversion    Ankle eversion     (Blank rows = not tested)    FUNCTIONAL TESTS:  Pt able to balance in SLS bil , functional squat WNL Pt gets audible snapping of hip flexor tendon on Rt with ASLR lowering phase  GAIT: Distance walked: within clinic Assistive device utilized: None Level of assistance: Complete Independence Comments: no signif gait deviations, suspect psoas inhibition on Rt   TODAY'S TREATMENT:                                                                                                                              DATE: 09/28/22  Discussion of evaluation findings and plan Option for DN discussed, Pt very nervous about it HEP for  standing hip flexor stretch with UE overhead reach with SB, standing straddle adductor stretch - Rt, 3x30" 2x/day (did not have time to give handouts)   PATIENT EDUCATION:  Education details: did not have time to give handouts, start [place] next time Person educated: Patient Education method: Consulting civil engineer, Demonstration, Verbal cues, and Handouts Education comprehension: verbalized understanding and returned demonstration  HOME EXERCISE PROGRAM: See above  ASSESSMENT:  CLINICAL IMPRESSION: Patient is a 52 y.o. female who was seen today for physical therapy evaluation and treatment for Rt LE ache and fatigue with 2+ mile mark for walking and hiking.  Pt has asymmetrical pelvis with muscle imbalance.  Rt inflare present with fair/poor SI joint mobility.  Signif tone present in adductors and hip flexors.  She gets audible snapping of Rt hip flexor tendon on ASLR lowering phase.  Rt psoas is very tight and is potentially inhibited; therefore fatigue sets in with walks/hikes and adductors and proximal quad are substituting.  Pt appears to have lack of muscle tone on Rt compared to left in gluteals.  Pt will benefit from soft tissue release techniques and strengthening/uptraining to restore mobility and balance to Rt hemipelvis to allow for QOL with hiking and walking for exercise.  OBJECTIVE IMPAIRMENTS: decreased activity tolerance, decreased balance, difficulty walking, decreased strength, increased muscle spasms, impaired flexibility, improper body mechanics, postural dysfunction, and pain.   ACTIVITY LIMITATIONS: locomotion level  PARTICIPATION LIMITATIONS: community activity and hiking  PERSONAL FACTORS: Time since onset of injury/illness/exacerbation are also affecting patient's functional outcome.   REHAB POTENTIAL: Excellent  CLINICAL DECISION MAKING: Stable/uncomplicated  EVALUATION COMPLEXITY: Low   GOALS: Goals reviewed with patient? Yes  SHORT TERM GOALS: Target date:  10/26/22 Pt will be ind with initial HEP for Rt hip stretches (hip flexor, adductor, piriformis) and gluteal strength Baseline: Goal status: INITIAL  2.  Pt will achieve symmetrical pelvis and maintain between visits Baseline:  Goal status: INITIAL   LONG TERM GOALS: Target date: 11/23/22  Pt will be ind with advanced HEP so she can maintain hiking distances Baseline:  Goal status: INITIAL  2.  Pt will be able to hike up to 8 miles without Rt LE fatigue. Baseline:  Goal status:  INITIAL  3.  Pt will achieve 5/5 Rt gluteal strength for improved elevation hiking and stairs Baseline:  Goal status: INITIAL  4.  Pt will verbalize understanding of psoas and gluteal role in pelvic mobility with gait to help her focus as fatigue sets in on longer hikes.  Baseline:  Goal status: INITIAL  5.  Pt will achieve at least 79% on FOTO to demo improved function Baseline: 69% Goal status: INITIAL     PLAN:  PT FREQUENCY: 1x/week  PT DURATION: 8 weeks  PLANNED INTERVENTIONS: Therapeutic exercises, Therapeutic activity, Neuromuscular re-education, Balance training, Gait training, Patient/Family education, Self Care, Joint mobilization, Joint manipulation, Dry Needling, Electrical stimulation, Spinal manipulation, Spinal mobilization, Cryotherapy, Moist heat, Taping, Traction, Ionotophoresis 4mg /ml Dexamethasone, and Manual therapy  PLAN FOR NEXT SESSION: start medbridge HEP and include straddle stance adductor stretch, standing hip flexor stretch with UE overhead reach with SB, gluteal (try bridge with feet on ball, fig 4  bridge, frog bridge options, fwd/lateral step up), DN if Pt interested (very nervous and worries she might pass out) - targets would be Rt adductors, quads, piriformis, gluteals   Kimi Kroft, PT 09/28/22 4:05 PM

## 2022-10-04 NOTE — Therapy (Signed)
OUTPATIENT PHYSICAL THERAPY TREATMENT NOTE   Patient Name: Stephanie Walton MRN: [id number] DOB:Jan 12, 1971, 52 y.o., female Today's Date: 10/06/2022   REFERRING PROVIDER: Lavada Mesi, MD   END OF SESSION:   PT End of Session - 10/06/22 0923     Visit Number 2    Date for PT Re-Evaluation 11/23/22    Authorization Type BCBS    PT Start Time 0930    PT Stop Time 1010    PT Time Calculation (min) 40 min    Activity Tolerance Patient tolerated treatment well    Behavior During Therapy [facility] for tasks assessed/performed             No past medical history on file. No past surgical history on file. 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 She can sense that she is irritating the area with continued walking.   PAIN:  Are you having pain? Yes NPRS scale: 5/10 Pain location: Rt proximal thigh anterior and lateral, a line from lateral hip to medial knee Pain orientation: Right  PAIN TYPE: aching and weak/fatigue Pain description: aching and fatigue   Aggravating factors: hiking and walking at mile 2, laying on Rt side Relieving factors: stretching  OBJECTIVE:    DIAGNOSTIC FINDINGS: n/a   PATIENT SURVEYS:  FOTO 69% goal 79%   COGNITION: Overall cognitive status: Within functional limits for tasks assessed                          SENSATION: WFL     MUSCLE LENGTH: Hamstrings WNL bil Rt adductors, hip flexors tight compared to Lt + Thomas test for ITB tightness bil   POSTURE: pelvic asymmetry present: Rt inflare    PALPATION: Rt psoas, adductors, glut min, glut med, piriformis tender to palpation Significant tone in Rt adductors Atrophy noted in Rt glut min, med, max compared to Lt   LOWER EXTREMITY ROM: WNL bil    LOWER EXTREMITY MMT:  MMT Right eval Left eval  Hip flexion 4+ 5  Hip extension 4 5  Hip abduction 4 4+  Hip adduction 4+ 5  Hip internal rotation      Hip external rotation 4 4+  Knee flexion 5 5  Knee extension 5 5  Ankle dorsiflexion      Ankle plantarflexion      Ankle inversion      Ankle eversion       (Blank rows = not tested)       FUNCTIONAL TESTS:  Pt able to balance in SLS bil , functional squat WNL Pt gets audible snapping of hip flexor tendon on Rt with ASLR lowering phase   GAIT: Distance walked: within clinic Assistive device utilized: None Level of assistance: Complete Independence Comments: no signif gait deviations, suspect psoas inhibition on Rt     TODAY'S TREATMENT:                                                                                                                              DATE:  10/05/2022:  Nustep 6 min, lvl 3, discussion about dry needling.  Modified quad and hip flexor stretch 2x30  Sideling clams 2x15, bilat with blue loop.  Bridges 2x15 with VC for TrA, glute and HS activation PPT 2x10  TrA 5 sec hold x10  Dead bugs with focus on TrA control x10  SKTC 2x20, bilat  LTR 2x30 sec   09/28/22  Discussion of evaluation findings and plan Option for DN discussed, Pt very nervous about it HEP for standing hip flexor stretch with UE overhead reach with SB, standing straddle adductor stretch - Rt, 3x30" 2x/day (did not have time to give handouts)     PATIENT EDUCATION:  Education details: did not have time to give handouts, start  [place] next time Person educated: Patient Education method: Explanation, Demonstration, Verbal cues, and Handouts Education comprehension: verbalized understanding and returned demonstration   HOME EXERCISE PROGRAM: Access Code: [email or web address] URL: [email or web address] Date: 10/06/2022 Prepared by: Rudi Heap  Exercises - Modified Thomas Stretch  - 1 x daily - 7 x weekly - 2 sets - 2 reps - 30 hold - Supine Lower Trunk Rotation  - 1 x daily - 7 x weekly - 2 sets - 10 reps - Supine Bridge  - 1 x daily - 7 x weekly - 2 sets - 10 reps - Supine Posterior Pelvic Tilt  - 1 x daily - 7 x weekly - 2 sets - 10 reps - Clamshell with Resistance  - 1 x daily - 7 x weekly - 2 sets - 10 reps - Supine Dead Bug with Leg Extension  - 1 x daily - 7 x weekly - 2 sets - 10 reps - Abdominal Bracing  - 1 x daily - 7 x weekly - 2 sets - 10 reps - Side Lunge Adductor Stretch  - 1 x daily - 7 x weekly - 2 sets -  2 reps - 30 hold - Standing Hip Flexor Stretch  - 1 x daily - 7 x weekly - 2 sets - 2 reps - 30 hold - Latissimus Dorsi Stretch at Wall  - 1 x daily - 7 x weekly - 2 sets - 2 reps - 30 hold   ASSESSMENT:   CLINICAL IMPRESSION: Patient presents back to PT with reports of continued pain in her hip with hiking. After further discussion about TPDN, pt continues to deny due to nervousness. Session with focus on proximal hip and lower trunk flexibility and strengthening. Pt tolerated all prescribed exercises well today with no adverse effects noted. VC for proper forum with clams today. Pt noted muscle fatigue at end of session. She was provided a comprehensive HEP. Pt will continue to benefit from skilled PT to address continued deficits.    OBJECTIVE IMPAIRMENTS: decreased activity tolerance, decreased balance, difficulty walking, decreased strength, increased muscle spasms, impaired flexibility, improper body mechanics, postural dysfunction, and pain.    ACTIVITY LIMITATIONS: locomotion  level   PARTICIPATION LIMITATIONS: community activity and hiking   PERSONAL FACTORS: Time since onset of injury/illness/exacerbation are also affecting patient's functional outcome.    REHAB POTENTIAL: Excellent   CLINICAL DECISION MAKING: Stable/uncomplicated   EVALUATION COMPLEXITY: Low     GOALS: Goals reviewed with patient? Yes   SHORT TERM GOALS: Target date: 10/26/22 Pt will be ind with initial HEP for Rt hip stretches (hip flexor, adductor, piriformis) and gluteal strength Baseline: Goal status: INITIAL   2.  Pt will achieve symmetrical pelvis and maintain between visits Baseline:  Goal status: INITIAL     LONG TERM GOALS: Target date: 11/23/22   Pt will be ind with advanced HEP so she can maintain hiking distances Baseline:  Goal status: INITIAL   2.  Pt will be able to hike up to 8 miles without Rt LE fatigue. Baseline:  Goal status: INITIAL   3.  Pt will achieve 5/5 Rt gluteal strength for improved elevation hiking and stairs Baseline:  Goal status: INITIAL   4.  Pt will verbalize understanding of psoas and gluteal role in pelvic mobility with gait to help her focus as fatigue sets in on longer hikes.  Baseline:  Goal status: INITIAL   5.  Pt will achieve at least 79% on FOTO to demo improved function Baseline: 69% Goal status: INITIAL   PLAN:   PT FREQUENCY: 1x/week   PT DURATION: 8 weeks   PLANNED INTERVENTIONS: Therapeutic exercises, Therapeutic activity, Neuromuscular re-education, Balance training, Gait training, Patient/Family education, Self Care, Joint mobilization, Joint manipulation, Dry Needling, Electrical stimulation, Spinal manipulation, Spinal mobilization, Cryotherapy, Moist heat, Taping, Traction, Ionotophoresis 4mg /ml Dexamethasone, and Manual therapy   PLAN FOR NEXT SESSION:  DN if Pt interested (very nervous and worries she might pass out) - targets would be Rt adductors, quads, piriformis, gluteals. Continue with stretches and  strengthening of proximal hip's and glutes.     Lynden Ang, PT 10/06/2022, 11:28 AM

## 2022-10-06 ENCOUNTER — Ambulatory Visit: Payer: BC Managed Care – PPO | Admitting: Physical Therapy

## 2022-10-06 DIAGNOSIS — M25551 Pain in right hip: Secondary | ICD-10-CM

## 2022-10-06 DIAGNOSIS — M79604 Pain in right leg: Secondary | ICD-10-CM | POA: Diagnosis not present

## 2022-10-06 DIAGNOSIS — R252 Cramp and spasm: Secondary | ICD-10-CM

## 2022-10-06 DIAGNOSIS — R293 Abnormal posture: Secondary | ICD-10-CM

## 2022-10-11 ENCOUNTER — Encounter: Payer: Self-pay | Admitting: Physical Therapy

## 2022-10-11 ENCOUNTER — Ambulatory Visit: Payer: BC Managed Care – PPO | Admitting: Physical Therapy

## 2022-10-11 DIAGNOSIS — R293 Abnormal posture: Secondary | ICD-10-CM

## 2022-10-11 DIAGNOSIS — M25551 Pain in right hip: Secondary | ICD-10-CM

## 2022-10-11 DIAGNOSIS — R252 Cramp and spasm: Secondary | ICD-10-CM | POA: Diagnosis not present

## 2022-10-11 DIAGNOSIS — M79604 Pain in right leg: Secondary | ICD-10-CM | POA: Diagnosis not present

## 2022-10-11 NOTE — Therapy (Signed)
OUTPATIENT PHYSICAL THERAPY TREATMENT NOTE   Patient Name: Stephanie Walton MRN: [id number] DOB:1970-12-23, 52 y.o., female Today's Date: 10/11/2022   REFERRING PROVIDER: Eunice Blase, MD   END OF SESSION:   PT End of Session - 10/11/22 0759     Visit Number 3    Date for PT Re-Evaluation 11/23/22    Authorization Type BCBS    PT Start Time 0800    PT Stop Time 0847    PT Time Calculation (min) 47 min    Activity Tolerance Patient tolerated treatment well    Behavior During Therapy [facility] for tasks assessed/performed              History reviewed. No pertinent past medical history. History reviewed. No pertinent surgical history. 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 Yes NPRS scale: 0/10 Pain location: Rt proximal thigh anterior and lateral, a line from lateral hip to medial knee Pain orientation: Right  PAIN TYPE: aching and weak/fatigue Pain description: aching and fatigue   Aggravating factors: hiking and walking at mile 2, laying on Rt side Relieving factors: stretching  OBJECTIVE:    DIAGNOSTIC FINDINGS: n/a   PATIENT SURVEYS:  FOTO 69% goal 79%   COGNITION: Overall cognitive status: Within functional limits for tasks  assessed                         SENSATION: WFL     MUSCLE LENGTH: Hamstrings WNL bil Rt adductors, hip flexors tight compared to Lt + Thomas test for ITB tightness bil   POSTURE: pelvic asymmetry present: Rt inflare    PALPATION: Rt psoas, adductors, glut min, glut med, piriformis tender to palpation Significant tone in Rt adductors Atrophy noted in Rt glut min, med, max compared to Lt   LOWER EXTREMITY ROM: WNL bil  LOWER EXTREMITY MMT:   MMT Right eval Left eval  Hip flexion 4+ 5  Hip extension 4 5  Hip abduction 4 4+  Hip adduction 4+ 5  Hip internal rotation      Hip external rotation 4 4+  Knee flexion 5 5  Knee extension 5 5  Ankle dorsiflexion      Ankle plantarflexion      Ankle inversion      Ankle eversion       (Blank rows = not tested)       FUNCTIONAL TESTS:  Pt able to balance in SLS bil , functional squat WNL Pt gets audible snapping of hip flexor tendon on Rt with ASLR lowering phase   GAIT: Distance walked: within clinic Assistive device utilized: None Level of assistance: Complete Independence Comments: no signif gait deviations, suspect psoas inhibition on Rt     TODAY'S TREATMENT:                                                                                                                              DATE:  10/11/22: Recumbent bike L3x4' PT present to discuss status Standing hip flexor stretch with UE reach up and over bil 2x20" PPT x 10 Supine frog bridge x 10 Fig 4 single leg bridge x 5 bil (HEP progression of bridge) Quadruped hip flexion to extension x 5 bil, slow with holds in each extreme position x 5 sec (HEP) SL clam yellow loop x10 bil Pigeon stretch on edge of mat table 2x20" bil (HEP) Seated flexed trunk hip flexion isometric, then sit tall and maintain isometric 5x5" bil ("slumpy psoas") (HEP modified from pictured position to be done as described here) "Sassy walk" down long hallway and back x 3 rounds with PT demo  and then with TC on Pt's pelvis intially to feel multi-planar movement for posterior depression in terminal stance and anterior elevation with initial swing Runner's climb onto 8" step, 8 reps bil, with hold for control and awareness at top of exercise (HEP) Neuro re-ed throughout session: slow controlled movements with holds at extremes of exercise positioning for increased awareness of muscle recruitment and patterning, initiation of movement with maintained stability   10/05/2022:  Nustep 6 min, lvl 3, discussion about dry needling.  Modified quad and hip flexor stretch 2x30  Sideling clams 2x15, bilat with blue loop.  Bridges 2x15 with VC for TrA, glute and HS activation PPT 2x10  TrA 5 sec hold x10  Dead bugs with focus on TrA control x10  SKTC 2x20, bilat  LTR 2x30 sec   09/28/22  Discussion of evaluation findings and plan Option for DN discussed, Pt very nervous about it HEP for standing hip flexor stretch with UE overhead reach with SB, standing straddle adductor stretch - Rt, 3x30" 2x/day (did not have time to give handouts)     PATIENT EDUCATION:  Education details: did not have time to give handouts, start [place] next  time Person educated: Patient Education method: Explanation, Demonstration, Verbal cues, and Handouts Education comprehension: verbalized understanding and returned demonstration   HOME EXERCISE PROGRAM: Access Code: [email or web address] URL: [email or web address] Date: 10/11/2022 Prepared by: Loistine Simas Jovane Foutz  Exercises - Modified Thomas Stretch  - 1 x daily - 7 x weekly - 2 sets - 2 reps - 30 hold - Supine Lower Trunk Rotation  - 1 x daily - 7 x weekly - 2 sets - 10 reps - Supine Bridge  - 1 x daily - 7 x weekly - 2 sets - 10 reps - Clamshell with Resistance  - 1 x daily - 7 x weekly - 2 sets - 10 reps - Supine Dead Bug with Leg Extension  - 1 x daily - 7 x weekly - 2 sets - 10 reps - Abdominal Bracing  - 1 x daily - 7 x weekly - 2 sets - 10  reps - Side Lunge Adductor Stretch  - 1 x daily - 7 x weekly - 2 sets - 2 reps - 30 hold - Standing Hip Flexor Stretch  - 1 x daily - 7 x weekly - 2 sets - 2 reps - 30 hold - Latissimus Dorsi Stretch at Wall  - 1 x daily - 7 x weekly - 2 sets - 2 reps - 30 hold - Pigeon Pose  - 2 x daily - 7 x weekly - 1 sets - 2 reps - 30 hold - Hooklying Isometric Hip Flexion  - 1 x daily - 7 x weekly - 1 sets - 5 reps - 5 hold - Runner's Climb  - 1 x daily - 7 x weekly - 1 sets - 10 reps - 3 hold - Figure 4 Bridge  - 1 x daily - 7 x weekly - 2 sets - 5 reps - Beginner Front Arm Support  - 1 x daily - 7 x weekly - 2 sets - 5 reps - 3 hold   ASSESSMENT:   CLINICAL IMPRESSION: Pt reports she is feeling more awareness of her musculature along Rt hemipelvis and trunk and even into feet for which muscles she needs to strengthen and which are tight.  She notes her Rt/Lt hemipelvis feel different when walking but with concentration she can make them "do the same thing."  Session focused today on progressing Rt hip strength for psoas and extensors and linking hip to functional movement patterns for gait/hiking.  Lower reps with slower controlled movements with holds used for control and awareness.  Pt has more difficulty stabilizing in closed chain on Rt with initial reps but this improves with repetition suggesting that focused controlled training should be helpful.  She does get more fatigued on Rt side with final reps compared to Lt.  "Sassy walk" used after training to help Pt feel multi-planar pelvic movement with proper drivers of pelvic movement.  HEP progressed today (see above.)   OBJECTIVE IMPAIRMENTS: decreased activity tolerance, decreased balance, difficulty walking, decreased strength, increased muscle spasms, impaired flexibility, improper body mechanics, postural dysfunction, and pain.    ACTIVITY LIMITATIONS: locomotion level   PARTICIPATION LIMITATIONS: community activity and hiking   PERSONAL  FACTORS: Time since onset of injury/illness/exacerbation are also affecting patient's functional outcome.    REHAB POTENTIAL: Excellent   CLINICAL DECISION MAKING: Stable/uncomplicated   EVALUATION COMPLEXITY: Low     GOALS: Goals reviewed with patient? Yes   SHORT TERM GOALS: Target date: 10/26/22 Pt will be ind with initial HEP for Rt hip stretches (hip flexor, adductor,  piriformis) and gluteal strength Baseline: Goal status: ongoing   2.  Pt will achieve symmetrical pelvis and maintain between visits Baseline:  Goal status: INITIAL     LONG TERM GOALS: Target date: 11/23/22   Pt will be ind with advanced HEP so she can maintain hiking distances Baseline:  Goal status: INITIAL   2.  Pt will be able to hike up to 8 miles without Rt LE fatigue. Baseline:  Goal status: INITIAL   3.  Pt will achieve 5/5 Rt gluteal strength for improved elevation hiking and stairs Baseline:  Goal status: INITIAL   4.  Pt will verbalize understanding of psoas and gluteal role in pelvic mobility with gait to help her focus as fatigue sets in on longer hikes.  Baseline:  Goal status: INITIAL   5.  Pt will achieve at least 79% on FOTO to demo improved function Baseline: 69% Goal status: INITIAL   PLAN:   PT FREQUENCY: 1x/week   PT DURATION: 8 weeks   PLANNED INTERVENTIONS: Therapeutic exercises, Therapeutic activity, Neuromuscular re-education, Balance training, Gait training, Patient/Family education, Self Care, Joint mobilization, Joint manipulation, Dry Needling, Electrical stimulation, Spinal manipulation, Spinal mobilization, Cryotherapy, Moist heat, Taping, Traction, Ionotophoresis 4mg /ml Dexamethasone, and Manual therapy   PLAN FOR NEXT SESSION:  f/u on tolerance from last session, review HEP,      Makalya Nave, PT 10/11/22 9:13 AM

## 2022-11-02 ENCOUNTER — Encounter: Payer: Self-pay | Admitting: Physical Therapy

## 2022-11-02 ENCOUNTER — Ambulatory Visit: Payer: BC Managed Care – PPO | Attending: Family Medicine | Admitting: Physical Therapy

## 2022-11-02 DIAGNOSIS — R252 Cramp and spasm: Secondary | ICD-10-CM | POA: Insufficient documentation

## 2022-11-02 DIAGNOSIS — M25551 Pain in right hip: Secondary | ICD-10-CM | POA: Diagnosis not present

## 2022-11-02 DIAGNOSIS — R293 Abnormal posture: Secondary | ICD-10-CM | POA: Insufficient documentation

## 2022-11-02 NOTE — Therapy (Signed)
OUTPATIENT PHYSICAL THERAPY TREATMENT NOTE   Patient Name: Stephanie Walton MRN: [id number] DOB:Sep 30, 1970, 52 y.o., female Today's Date: 11/02/2022   REFERRING PROVIDER: Lavada Mesi, MD   END OF SESSION:   PT End of Session - 11/02/22 1359     Visit Number 4    Date for PT Re-Evaluation 11/23/22    Authorization Type BCBS    PT Start Time 1400    PT Stop Time 1440    PT Time Calculation (min) 40 min    Activity Tolerance Patient tolerated treatment well    Behavior During Therapy [facility] for tasks assessed/performed               History reviewed. No pertinent past medical history. History reviewed. No pertinent surgical history. 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  I woke up achy in Rt hip and leg today.  I continue to hit the wall at 1.5 to 2 mile part of my hikes.    PAIN:  Are you having pain? Yes NPRS scale: 0/10 Pain location: Rt proximal thigh anterior and lateral, a line from lateral hip to medial knee Pain orientation: Right  PAIN TYPE: aching and weak/fatigue Pain description: aching and fatigue   Aggravating factors: hiking and walking at mile 2, laying on Rt side Relieving factors: stretching  OBJECTIVE:    DIAGNOSTIC FINDINGS: n/a   PATIENT SURVEYS:  FOTO 69% goal 79%   COGNITION: Overall cognitive  status: Within functional limits for tasks assessed                         SENSATION: WFL     MUSCLE LENGTH: Hamstrings WNL bil Rt adductors, hip flexors tight compared to Lt + Thomas test for ITB tightness bil   POSTURE: pelvic asymmetry present: Rt inflare    PALPATION: Rt psoas, adductors, glut min, glut med, piriformis tender to palpation Significant tone in Rt adductors Atrophy noted in  Rt glut min, med, max compared to Lt   LOWER EXTREMITY ROM: WNL bil    LOWER EXTREMITY MMT:   MMT Right eval Left eval  Hip flexion 4+ 5  Hip extension 4 5  Hip abduction 4 4+  Hip adduction 4+ 5  Hip internal rotation      Hip external rotation 4 4+  Knee flexion 5 5  Knee extension 5 5  Ankle dorsiflexion      Ankle plantarflexion      Ankle inversion      Ankle eversion       (Blank rows = not tested)       FUNCTIONAL TESTS:  Pt able to balance in SLS bil , functional squat WNL Pt gets audible snapping of hip flexor tendon on Rt with ASLR lowering phase   GAIT: Distance walked: within clinic Assistive device utilized: None Level of assistance: Complete Independence Comments: no signif gait deviations, suspect psoas inhibition on Rt     TODAY'S TREATMENT:                                                                                                                              DATE:  11/02/22: Recumbent bike L3 x 4' PT present to discuss status Standing Rt psoas stretch with heel raise on Rt x 1' Wall plank with knee diagonal march to hip ext x10 each Supine HS, adductor and ITB stretch with strap bil 2x30" each Supine green loop at feet march and extend opp legs 2x16 Long sitting slouched SLR Rt to fatigue 3x, reps ranged from 3-6 Seated slumpy psoas 5x5" holds with breath awareness HEP:  In lunge position with both arms overhead, add dynamic heel raise up/down to hip flexor stretch x 15 heel raises Seated slumpy psoas - 5x 5" holds with breathing - you can add  load to this with balanced dumbbell on thigh and do slow motion small range marches 2x10 Long sitting slumpy posture straight leg raises to fatigue  Wall plank pull knee in at diagonal and then extend to active glute x 10 each   10/11/22: Recumbent bike L3x4' PT present to discuss status Standing hip flexor stretch with UE reach up and over bil 2x20" PPT x 10 Supine frog bridge x 10 Fig 4 single leg bridge x 5 bil (HEP progression of bridge) Quadruped hip flexion to extension x 5 bil, slow with holds in each extreme position x 5 sec (HEP) SL clam yellow loop x10 bil Pigeon stretch on edge of mat table 2x20" bil (HEP) Seated flexed trunk hip flexion isometric, then sit tall and maintain isometric 5x5" bil ("slumpy psoas") (HEP modified from pictured position to be done as described here) "Sassy walk" down long hallway and back x 3 rounds with PT demo and then with TC on Pt's pelvis intially to feel multi-planar movement for posterior depression in terminal stance and anterior elevation with initial swing Runner's climb onto 8" step,  8 reps bil, with hold for control and awareness at top of exercise (HEP) Neuro re-ed throughout session: slow controlled movements with holds at extremes of exercise positioning for increased awareness of muscle recruitment and patterning, initiation of movement with maintained stability   10/05/2022:  Nustep 6 min, lvl 3, discussion about dry needling.  Modified quad and hip flexor stretch 2x30  Sideling clams 2x15, bilat with blue loop.  Bridges 2x15 with VC for TrA, glute and HS activation PPT 2x10  TrA 5 sec hold x10  Dead bugs with focus on TrA control x10  SKTC 2x20, bilat  LTR 2x30 sec       PATIENT EDUCATION:  Education details: did not have time to give handouts, start [place] next time Person educated: Patient Education method: Consulting civil engineer, Demonstration, Verbal cues, and Handouts Education comprehension: verbalized understanding and returned  demonstration   HOME EXERCISE PROGRAM: Access Code: [email or web address] URL: [email or web address] Date: 10/11/2022 Prepared by: Venetia Night [place] Agostinelli  Exercises - Modified Thomas Stretch  - 1 x daily - 7 x weekly - 2 sets - 2 reps - 30 hold - Supine Lower Trunk Rotation  - 1 x daily - 7 x weekly - 2 sets - 10 reps - Supine Bridge  - 1 x daily - 7 x weekly - 2 sets - 10 reps - Clamshell with Resistance  - 1 x daily - 7 x weekly - 2 sets - 10 reps - Supine Dead Bug with Leg Extension  - 1 x daily - 7 x weekly - 2 sets - 10 reps - Abdominal Bracing  - 1 x daily - 7 x weekly - 2 sets - 10 reps - Side Lunge Adductor Stretch  - 1 x daily - 7 x weekly - 2 sets - 2 reps - 30 hold - Standing Hip Flexor Stretch  - 1 x daily - 7 x weekly - 2 sets - 2 reps - 30 hold - Latissimus Dorsi Stretch at Wall  - 1 x daily - 7 x weekly - 2 sets - 2 reps - 30 hold - Pigeon Pose  - 2 x daily - 7 x weekly - 1 sets - 2 reps - 30 hold - Hooklying Isometric Hip Flexion  - 1 x daily - 7 x weekly - 1 sets - 5 reps - 5 hold - Runner's Climb  - 1 x daily - 7 x weekly - 1 sets - 10 reps - 3 hold - Figure 4 Bridge  - 1 x daily - 7 x weekly - 2 sets - 5 reps - Beginner Front Arm Support  - 1 x daily - 7 x weekly - 2 sets - 5 reps - 3 hold   ASSESSMENT:   CLINICAL IMPRESSION: Pt reports partial compliance with HEP.  PT and Pt noted that she has been avoiding all hip flexor training which is the area she is very weak and grows fatigued in.  PT progressed targeted therex to highlight this anterior hip weakness and gave further written HEP instructions above.  Pt fatigues very quickly with psoas training.  She continues to hit a "wall" during walks/hikes at mile 1.5-2 mile.    OBJECTIVE IMPAIRMENTS: decreased activity tolerance, decreased balance, difficulty walking, decreased strength, increased muscle spasms, impaired flexibility, improper body mechanics, postural dysfunction, and pain.    ACTIVITY LIMITATIONS: locomotion  level   PARTICIPATION LIMITATIONS: community activity and hiking   PERSONAL FACTORS: Time since onset of injury/illness/exacerbation are also affecting patient's functional outcome.  REHAB POTENTIAL: Excellent   CLINICAL DECISION MAKING: Stable/uncomplicated   EVALUATION COMPLEXITY: Low     GOALS: Goals reviewed with patient? Yes   SHORT TERM GOALS: Target date: 10/26/22 Pt will be ind with initial HEP for Rt hip stretches (hip flexor, adductor, piriformis) and gluteal strength Baseline: Goal status: ongoing   2.  Pt will achieve symmetrical pelvis and maintain between visits Baseline:  Goal status: INITIAL     LONG TERM GOALS: Target date: 11/23/22   Pt will be ind with advanced HEP so she can maintain hiking distances Baseline:  Goal status: INITIAL   2.  Pt will be able to hike up to 8 miles without Rt LE fatigue. Baseline:  Goal status: INITIAL   3.  Pt will achieve 5/5 Rt gluteal strength for improved elevation hiking and stairs Baseline:  Goal status: INITIAL   4.  Pt will verbalize understanding of psoas and gluteal role in pelvic mobility with gait to help her focus as fatigue sets in on longer hikes.  Baseline:  Goal status: INITIAL   5.  Pt will achieve at least 79% on FOTO to demo improved function Baseline: 69% Goal status: INITIAL   PLAN:   PT FREQUENCY: 1x/week   PT DURATION: 8 weeks   PLANNED INTERVENTIONS: Therapeutic exercises, Therapeutic activity, Neuromuscular re-education, Balance training, Gait training, Patient/Family education, Self Care, Joint mobilization, Joint manipulation, Dry Needling, Electrical stimulation, Spinal manipulation, Spinal mobilization, Cryotherapy, Moist heat, Taping, Traction, Ionotophoresis 4mg /ml Dexamethasone, and Manual therapy   PLAN FOR NEXT SESSION:  f/u on tolerance from last session, review HEP,      Natassja Ollis, PT 11/02/22 2:47 PM

## 2022-11-02 NOTE — Patient Instructions (Signed)
In lunge position with both arms overhead, add dynamic heel raise up/down to hip flexor stretch x 15 heel raises Seated slumpy psoas - 5x 5" holds with breathing - you can add load to this with balanced dumbbell on thigh and do slow motion small range marches 2x10 Long sitting slumpy posture straight leg raises to fatigue  Wall plank pull knee in at diagonal and then extend to active glute x 10 each

## 2022-11-09 ENCOUNTER — Encounter: Payer: Self-pay | Admitting: Physical Therapy

## 2022-11-09 ENCOUNTER — Ambulatory Visit: Payer: BC Managed Care – PPO | Admitting: Physical Therapy

## 2022-11-09 DIAGNOSIS — R252 Cramp and spasm: Secondary | ICD-10-CM

## 2022-11-09 DIAGNOSIS — M25551 Pain in right hip: Secondary | ICD-10-CM

## 2022-11-09 DIAGNOSIS — R293 Abnormal posture: Secondary | ICD-10-CM | POA: Diagnosis not present

## 2022-11-09 NOTE — Therapy (Signed)
OUTPATIENT PHYSICAL THERAPY TREATMENT NOTE   Patient Name: Stephanie Walton MRN: [id number] DOB:[birth year], 52 y.o., female Today's Date: 11/09/2022   REFERRING PROVIDER: Eunice Blase, MD   END OF SESSION:   PT End of Session - 11/09/22 1235     Visit Number 5    Date for PT Re-Evaluation 11/23/22    Authorization Type BCBS    PT Start Time 1230    PT Stop Time 1315    PT Time Calculation (min) 45 min    Activity Tolerance Patient tolerated treatment well    Behavior During Therapy [facility] for tasks assessed/performed                History reviewed. No pertinent past medical history. History reviewed. No pertinent surgical history. 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    PAIN:  Are you having pain? Yes NPRS scale: 0/10 Pain location: Rt proximal thigh anterior and lateral, a line from lateral hip to medial knee Pain orientation: Right  PAIN TYPE: aching and weak/fatigue Pain description: aching and fatigue   Aggravating factors: hiking and walking at mile 2, laying on Rt side Relieving factors: stretching  OBJECTIVE:    DIAGNOSTIC FINDINGS: n/a   PATIENT SURVEYS:  FOTO 69% goal 79%   COGNITION: Overall cognitive status: Within functional limits for tasks assessed                          SENSATION: WFL     MUSCLE LENGTH: Hamstrings WNL bil Rt adductors, hip flexors tight compared to Lt + Thomas test for ITB tightness bil   POSTURE: pelvic asymmetry present: Rt inflare    PALPATION: Rt psoas, adductors, glut min, glut med, piriformis tender to palpation Significant tone in Rt adductors Atrophy noted in Rt glut min, med, max compared to Lt   LOWER EXTREMITY ROM: WNL bil  LOWER EXTREMITY MMT:   MMT Right eval Left eval  Hip flexion 4+ 5  Hip extension 4 5  Hip abduction 4 4+  Hip adduction 4+ 5  Hip internal rotation      Hip external rotation 4 4+  Knee flexion 5 5  Knee extension 5 5  Ankle dorsiflexion      Ankle plantarflexion      Ankle inversion      Ankle eversion       (Blank rows = not tested)       FUNCTIONAL TESTS:  Pt able to balance in SLS bil , functional squat WNL Pt gets audible snapping of hip flexor tendon on Rt with ASLR lowering phase   GAIT: Distance walked: within clinic Assistive device utilized: None Level of assistance: Complete Independence Comments: no signif gait deviations, suspect psoas inhibition on Rt     TODAY'S TREATMENT:                                                                                                                              DATE:  11/09/22: Recumbent bike L3 x 4' PT present to discuss status Lt SL pelvic PNF passive and resisted to Rt hemipelvis, followed by addition of Rt LE PNF resisted Strain counterstrain Rt psoas Direct STM Rt psoas, proximal adductors, quads - manual massage and Addaday assisted Trial of prone Rt psoas release on firm pressure release ball Review of slumpy long sitting SLR  11/02/22: Recumbent bike L3 x 4' PT present to discuss status Standing Rt psoas stretch with heel raise on Rt x 1' Wall plank with knee diagonal march to hip ext x10 each Supine HS, adductor and ITB stretch with strap bil 2x30" each Supine green loop at feet march and extend opp legs  2x16 Long sitting slouched SLR Rt to fatigue 3x, reps ranged from 3-6 Seated slumpy psoas 5x5" holds with breath awareness HEP:  In lunge position with both arms overhead, add dynamic heel raise up/down to hip flexor stretch x 15 heel raises Seated slumpy psoas - 5x 5" holds with breathing - you can add load to this with balanced dumbbell on thigh and do slow motion small range marches 2x10 Long sitting slumpy posture straight leg raises to fatigue  Wall plank pull knee in at diagonal and then extend to active glute x 10 each     PATIENT EDUCATION:  Education details: did not have time to give handouts, start [place] next time Person educated: Patient Education method: Consulting civil engineer, Demonstration, Verbal cues, and Handouts Education comprehension: verbalized understanding and returned demonstration   HOME EXERCISE PROGRAM: Access Code: [email or web address] URL: [email or web address] Date: 10/11/2022 Prepared by: Venetia Night Senta Kantor  Exercises - Modified Thomas Stretch  - 1 x daily - 7 x weekly - 2 sets - 2 reps - 30 hold - Supine Lower Trunk Rotation  - 1 x daily - 7 x weekly - 2 sets - 10 reps - Supine Bridge  -  1 x daily - 7 x weekly - 2 sets - 10 reps - Clamshell with Resistance  - 1 x daily - 7 x weekly - 2 sets - 10 reps - Supine Dead Bug with Leg Extension  - 1 x daily - 7 x weekly - 2 sets - 10 reps - Abdominal Bracing  - 1 x daily - 7 x weekly - 2 sets - 10 reps - Side Lunge Adductor Stretch  - 1 x daily - 7 x weekly - 2 sets - 2 reps - 30 hold - Standing Hip Flexor Stretch  - 1 x daily - 7 x weekly - 2 sets - 2 reps - 30 hold - Latissimus Dorsi Stretch at Wall  - 1 x daily - 7 x weekly - 2 sets - 2 reps - 30 hold - Pigeon Pose  - 2 x daily - 7 x weekly - 1 sets - 2 reps - 30 hold - Hooklying Isometric Hip Flexion  - 1 x daily - 7 x weekly - 1 sets - 5 reps - 5 hold - Runner's Climb  - 1 x daily - 7 x weekly - 1 sets - 10 reps - 3 hold - Figure 4 Bridge  - 1 x daily - 7 x  weekly - 2 sets - 5 reps - Beginner Front Arm Support  - 1 x daily - 7 x weekly - 2 sets - 5 reps - 3 hold   ASSESSMENT:   CLINICAL IMPRESSION: Pt continues to hit a "wall" during walks/hikes at mile 1.5-2 mile. She has significant increased tone in Rt hip flexors, proximal adductors and quad tendon at ASIS.  PT applied indirect and direct techniques today including strain counterstrain, STM, pressure release and Addaday assisted STM.  Neuro re-ed via PNF of Rt hemipelvis and Rt LE demo Pt with good activation patterns.  She will consider getting massage gun to use prior to and after hikes to see if this helps.     OBJECTIVE IMPAIRMENTS: decreased activity tolerance, decreased balance, difficulty walking, decreased strength, increased muscle spasms, impaired flexibility, improper body mechanics, postural dysfunction, and pain.    ACTIVITY LIMITATIONS: locomotion level   PARTICIPATION LIMITATIONS: community activity and hiking   PERSONAL FACTORS: Time since onset of injury/illness/exacerbation are also affecting patient's functional outcome.    REHAB POTENTIAL: Excellent   CLINICAL DECISION MAKING: Stable/uncomplicated   EVALUATION COMPLEXITY: Low     GOALS: Goals reviewed with patient? Yes   SHORT TERM GOALS: Target date: 10/26/22 Pt will be ind with initial HEP for Rt hip stretches (hip flexor, adductor, piriformis) and gluteal strength Baseline: Goal status: ongoing   2.  Pt will achieve symmetrical pelvis and maintain between visits Baseline:  Goal status: INITIAL     LONG TERM GOALS: Target date: 11/23/22   Pt will be ind with advanced HEP so she can maintain hiking distances Baseline:  Goal status: INITIAL   2.  Pt will be able to hike up to 8 miles without Rt LE fatigue. Baseline:  Goal status: INITIAL   3.  Pt will achieve 5/5 Rt gluteal strength for improved elevation hiking and stairs Baseline:  Goal status: INITIAL   4.  Pt will verbalize understanding of  psoas and gluteal role in pelvic mobility with gait to help her focus as fatigue sets in on longer hikes.  Baseline:  Goal status: INITIAL   5.  Pt will achieve at least 79% on FOTO to demo improved function Baseline: 69%  Goal status: INITIAL   PLAN:   PT FREQUENCY: 1x/week   PT DURATION: 8 weeks   PLANNED INTERVENTIONS: Therapeutic exercises, Therapeutic activity, Neuromuscular re-education, Balance training, Gait training, Patient/Family education, Self Care, Joint mobilization, Joint manipulation, Dry Needling, Electrical stimulation, Spinal manipulation, Spinal mobilization, Cryotherapy, Moist heat, Taping, Traction, Ionotophoresis 1m/ml Dexamethasone, and Manual therapy   PLAN FOR NEXT SESSION:  did Pt get massage gun, did Pt walk before session, mobilize upper lumbar spine to see if reduces tone in Rt anterior / medial hip    Jaz Mallick, PT 11/09/22 1:52 PM

## 2022-11-16 ENCOUNTER — Encounter: Payer: Self-pay | Admitting: Physical Therapy

## 2022-11-16 ENCOUNTER — Ambulatory Visit: Payer: BC Managed Care – PPO | Admitting: Physical Therapy

## 2022-11-16 DIAGNOSIS — R252 Cramp and spasm: Secondary | ICD-10-CM | POA: Diagnosis not present

## 2022-11-16 DIAGNOSIS — R293 Abnormal posture: Secondary | ICD-10-CM

## 2022-11-16 DIAGNOSIS — M25551 Pain in right hip: Secondary | ICD-10-CM | POA: Diagnosis not present

## 2022-11-16 NOTE — Therapy (Signed)
OUTPATIENT PHYSICAL THERAPY TREATMENT NOTE   Patient Name: Stephanie Walton MRN: [id number] DOB:1970/10/16, 52 y.o., female Today's Date: 11/16/2022   REFERRING PROVIDER: Eunice Blase, MD   END OF SESSION:   PT End of Session - 11/16/22 1359     Visit Number 6    Date for PT Re-Evaluation 01/25/23    Authorization Type BCBS    PT Start Time 1400    PT Stop Time 1445    PT Time Calculation (min) 45 min    Activity Tolerance Patient tolerated treatment well    Behavior During Therapy [facility] for tasks assessed/performed                 History reviewed. No pertinent past medical history. History reviewed. No pertinent surgical history. 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  I feel like I'm stabilizing more.  I am more flexible.  My Rt hip/leg still gets very weak at mile 2.  I am doing my HEP all the time.  PAIN:  Are you having pain? Yes NPRS scale: 0/10 Pain location: Rt proximal thigh anterior and lateral, a line from lateral hip to medial knee Pain orientation: Right  PAIN TYPE: aching and weak/fatigue Pain description: aching and fatigue   Aggravating factors: hiking and walking at mile 2, laying on Rt side Relieving factors: stretching  OBJECTIVE:    DIAGNOSTIC FINDINGS: n/a   PATIENT SURVEYS:  FOTO 11/16/22: 79% met  goal FOTO 69% goal 79%   COGNITION: Overall cognitive status: Within functional limits for tasks assessed                         SENSATION: [facility]     MUSCLE LENGTH: 2/22:  THOMAS TEST: + on Rt for ITB tightness  Hamstrings WNL bil Rt adductors, hip flexors tight compared to Lt + Thomas test for ITB tightness bil   POSTURE:  2/22: symmetrical pelvis  pelvic asymmetry present:  Rt inflare    PALPATION:  Rt psoas, adductors, glut min, glut med, piriformis tender to palpation Significant tone in Rt adductors Atrophy noted in Rt glut min, med, max compared to Lt   LOWER EXTREMITY ROM: WNL bil    LOWER EXTREMITY MMT:   MMT Right eval Left eval RIGHT 2/22 LEFT 2/22  Hip flexion 4+ 5 4+ 5  Hip extension 4 5 4 5  $ Hip abduction 4 4+ 4 4+  Hip adduction 4+ 5 4+ 5  Hip internal rotation     4+ 5  Hip external rotation 4 4+ 4 4+  Knee flexion 5 5 5 5  $ Knee extension 5 5 5 5  $ Ankle dorsiflexion        Ankle plantarflexion        Ankle inversion        Ankle eversion         (Blank rows = not tested)       FUNCTIONAL TESTS:  2/22: Single leg squat: on Rt knee valgus occurs, more challenges with SLS balance than on Lt  Pt able to balance in SLS bil , functional squat WNL Pt gets audible snapping of hip flexor tendon on Rt with ASLR lowering phase   GAIT: Distance walked: within clinic Assistive device utilized: None Level of assistance: Complete Independence Comments: no signif gait deviations, suspect psoas inhibition on Rt     TODAY'S TREATMENT:                                                                                                                              DATE:  11/16/22: Standing T with 10lb weight x10 bil Rt SLS clock taps x 8 rounds - no knee bend for now but can slowly progress to mini single leg squat with reach taps Bil squat to Rt SLS with Lt knee march - option to hold weight Monster walks fwd/bws red loop band HEP  progression  11/09/22: Recumbent bike L3 x 4' PT present to discuss status Lt SL pelvic PNF passive and resisted to Rt hemipelvis, followed by addition of Rt LE PNF resisted Strain counterstrain Rt psoas Direct STM Rt psoas, proximal adductors, quads - manual massage and Addaday assisted Trial of prone Rt psoas release on firm pressure release ball Review of slumpy long sitting SLR  11/02/22: Recumbent bike L3 x 4' PT present to discuss status Standing Rt psoas stretch with heel raise on Rt x 1' Wall plank with knee diagonal march to hip ext x10 each Supine HS, adductor and ITB stretch with strap bil 2x30" each Supine green loop at feet march and extend opp legs 2x16 Long sitting slouched SLR Rt to fatigue 3x, reps ranged from 3-6 Seated slumpy psoas 5x5" holds with breath awareness HEP:  In lunge position with both arms overhead, add dynamic heel raise up/down to hip flexor stretch x 15 heel raises Seated slumpy psoas - 5x 5" holds with breathing -  you can add load to this with balanced dumbbell on thigh and do slow motion small range marches 2x10 Long sitting slumpy posture straight leg raises to fatigue  Wall plank pull knee in at diagonal and then extend to active glute x 10 each     PATIENT EDUCATION:  Education details: did not have time to give handouts, start [place] next time Person educated: Patient Education method: Explanation, Demonstration, Verbal cues, and Handouts Education comprehension: verbalized understanding and returned demonstration   HOME EXERCISE PROGRAM: Access Code: [email or web address] URL: [email or web address] Date: 11/16/2022 Prepared by: Baruch Merl  Exercises - Modified Thomas Stretch  - 1 x daily - 7 x weekly - 2 sets - 2 reps - 30 hold - Supine Lower Trunk Rotation  - 1 x daily - 7 x weekly - 2 sets - 10 reps - Supine Bridge  - 1 x daily - 7 x weekly - 2 sets - 10 reps - Clamshell with Resistance  - 1 x daily - 7 x weekly - 2 sets -  10 reps - Supine Dead Bug with Leg Extension  - 1 x daily - 7 x weekly - 2 sets - 10 reps - Abdominal Bracing  - 1 x daily - 7 x weekly - 2 sets - 10 reps - Side Lunge Adductor Stretch  - 1 x daily - 7 x weekly - 2 sets - 2 reps - 30 hold - Standing Hip Flexor Stretch  - 1 x daily - 7 x weekly - 2 sets - 2 reps - 30 hold - Latissimus Dorsi Stretch at Wall  - 1 x daily - 7 x weekly - 2 sets - 2 reps - 30 hold - Pigeon Pose  - 2 x daily - 7 x weekly - 1 sets - 2 reps - 30 hold - Hooklying Isometric Hip Flexion  - 1 x daily - 7 x weekly - 1 sets - 5 reps - 5 hold - Runner's Climb  - 1 x daily - 7 x weekly - 1 sets - 10 reps - 3 hold - Figure 4 Bridge  - 1 x daily - 7 x weekly - 2 sets - 5 reps - Beginner Front Arm Support  - 1 x daily - 7 x weekly - 2 sets - 5 reps - 3 hold - Single-Leg [place] Deadlift With Kettlebell  - 1 x daily - 7 x weekly - 2 sets - 10 reps - Single Leg Balance with Opposite Leg Star Reach  - 1 x daily - 7 x weekly - 2 sets - 5 reps - Backward Monster Walk with Resistance (BKA)  - 1 x daily - 7 x weekly - 2 sets - 10 reps - Forward Monster Walk with Resistance (BKA)  - 1 x daily - 7 x weekly - 2 sets - 10 reps   ASSESSMENT:   CLINICAL IMPRESSION: Pt arrives having walked prior to session as requested by PT.  She demos asymmetry in strength due to fatigue in Rt hip flexors, abductors and ER.  Her Marcello Moores test is still + for Rt ITB tightness.  She has subtle atrophy of Rt sided lumbar multifi L3/4, L4/5, L5/S1.  She is able to progress to more challenges today into Rt SLS closed chain coordination and activation of hip stabilizers.  She has some knee valgus with single leg squat.  Plan to taper to every other week given good compliance with HEP but with a need for further progression of training bi-monthly given  activity goals.  FOTO goal was met today at 79%.   OBJECTIVE IMPAIRMENTS: decreased activity tolerance, decreased balance, difficulty walking, decreased strength,  increased muscle spasms, impaired flexibility, improper body mechanics, postural dysfunction, and pain.    ACTIVITY LIMITATIONS: locomotion level   PARTICIPATION LIMITATIONS: community activity and hiking   PERSONAL FACTORS: Time since onset of injury/illness/exacerbation are also affecting patient's functional outcome.    REHAB POTENTIAL: Excellent   CLINICAL DECISION MAKING: Stable/uncomplicated   EVALUATION COMPLEXITY: Low     GOALS: Goals reviewed with patient? Yes   SHORT TERM GOALS: Target date: 10/26/22 Pt will be ind with initial HEP for Rt hip stretches (hip flexor, adductor, piriformis) and gluteal strength Baseline: Goal status: met   2.  Pt will achieve symmetrical pelvis and maintain between visits Baseline:  Goal status: met     LONG TERM GOALS: Target date: 01/25/23   Pt will be ind with advanced HEP so she can maintain hiking distances Baseline:  Goal status: ongoing   2.  Pt will be able to hike up to 8 miles without Rt LE fatigue. Baseline: fatigues at 2 miles Goal status: ongoing   3.  Pt will achieve 5/5 Rt gluteal strength for improved elevation hiking and stairs Baseline:  Goal status: ongoing   4.  Pt will verbalize understanding of psoas and gluteal role in pelvic mobility with gait to help her focus as fatigue sets in on longer hikes.  Baseline:  Goal status: ongoing    5.  Pt will achieve at least 79% on FOTO to demo improved function Baseline: 69%, 79% on 2/22 Goal status: met   PLAN:   PT FREQUENCY: every other week   PT DURATION: 8 weeks   PLANNED INTERVENTIONS: Therapeutic exercises, Therapeutic activity, Neuromuscular re-education, Balance training, Gait training, Patient/Family education, Self Care, Joint mobilization, Joint manipulation, Dry Needling, Electrical stimulation, Spinal manipulation, Spinal mobilization, Cryotherapy, Moist heat, Taping, Traction, Ionotophoresis 27m/ml Dexamethasone, and Manual therapy   PLAN FOR NEXT  SESSION:  review and progress HEP, add dead lift with more weight   Ersel Wadleigh, PT 11/16/22 3:42 PM

## 2022-11-29 ENCOUNTER — Ambulatory Visit: Payer: BC Managed Care – PPO | Attending: Family Medicine | Admitting: Physical Therapy

## 2022-11-29 ENCOUNTER — Encounter: Payer: Self-pay | Admitting: Physical Therapy

## 2022-11-29 DIAGNOSIS — M25551 Pain in right hip: Secondary | ICD-10-CM | POA: Diagnosis not present

## 2022-11-29 DIAGNOSIS — R293 Abnormal posture: Secondary | ICD-10-CM | POA: Insufficient documentation

## 2022-11-29 DIAGNOSIS — R252 Cramp and spasm: Secondary | ICD-10-CM | POA: Diagnosis not present

## 2022-11-29 NOTE — Therapy (Signed)
OUTPATIENT PHYSICAL THERAPY TREATMENT NOTE   Patient Name: Lorely Demir MRN: [id number] DOB:1971-09-14, 52 y.o., female Today's Date: 11/29/2022   REFERRING PROVIDER: Eunice Blase, MD   END OF SESSION:   PT End of Session - 11/29/22 1142     Visit Number 7    Date for PT Re-Evaluation 01/25/23    Authorization Type BCBS    PT Start Time [id number]    PT Stop Time 1228    PT Time Calculation (min) 43 min    Activity Tolerance Patient tolerated treatment well    Behavior During Therapy [facility] for tasks assessed/performed                  History reviewed. No pertinent past medical history. History reviewed. No pertinent surgical history. 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 I didn't think about any weakness while traveling.  I worked on body weight strength and stretches while gone.    PAIN:  Are you having pain? Yes NPRS scale: 0/10 Pain location: Rt proximal thigh anterior and lateral, a line from lateral hip to medial knee Pain orientation: Right  PAIN TYPE: aching and weak/fatigue Pain description: aching and fatigue   Aggravating factors: hiking and walking at mile 2, laying on Rt side Relieving factors: stretching  OBJECTIVE:    DIAGNOSTIC FINDINGS: n/a   PATIENT SURVEYS:  FOTO 11/16/22: 79% met goal FOTO 69% goal 79%    COGNITION: Overall cognitive status: Within functional limits for tasks assessed                         SENSATION: WFL     MUSCLE LENGTH: 2/22:  THOMAS TEST: + on Rt for ITB tightness  Hamstrings WNL bil Rt adductors, hip flexors tight compared to Lt + Thomas test for ITB tightness bil   POSTURE:  2/22: symmetrical pelvis  pelvic asymmetry present: Rt inflare    PALPATION:  Rt psoas, adductors, glut min, glut  med, piriformis tender to palpation Significant tone in Rt adductors Atrophy noted in Rt glut min, med, max compared to Lt   LOWER EXTREMITY ROM: WNL bil    LOWER EXTREMITY MMT:   MMT Right eval Left eval RIGHT 2/22 LEFT 2/22  Hip flexion 4+ 5 4+ 5  Hip extension '4 5 4 5  '$ Hip abduction 4 4+ 4 4+  Hip adduction 4+ 5 4+ 5  Hip internal rotation     4+ 5  Hip external rotation 4 4+ 4 4+  Knee flexion '5 5 5 5  '$ Knee extension '5 5 5 5  '$ Ankle dorsiflexion        Ankle plantarflexion        Ankle inversion        Ankle eversion         (Blank rows = not tested)       FUNCTIONAL TESTS:  2/22: Single leg squat: on Rt knee valgus occurs, more challenges with SLS balance than on Lt  Pt able to balance in SLS bil , functional squat WNL Pt gets audible snapping of hip flexor tendon on Rt with ASLR lowering phase   GAIT: Distance walked: within clinic Assistive device utilized: None Level of assistance: Complete Independence Comments: no signif gait deviations, suspect psoas inhibition on Rt     TODAY'S TREATMENT:                                                                                                                              DATE:  11/29/22: Recumbent bike L3 x 4' PT present to discuss status Rt psoas stretch with Lt SB and overhead reach bil 1x20" Squat with 10lb chest press 2 x 10 Squat to march onto Rt LE hold 2x 10 10lb Slider lunge on Rt LE x10 lateral and bwd each Stagger stance lat row for opp lat and glut 2x10 10lb Squat with low  cable pulley bil row on stand 10lb 2 x 10 Hip matrix 25lb 2x10 each abd/ext/flexion Slumpy psoas 5x10" holds (march isometric from slumped posture to upright posture) Leg press seat 6 70lb bil LE  2x10, Rt LE only 35lb 2x10 Mountain climbers on sliders in chair plank to fatigue x 2 sets  11/16/22: Standing T with 10lb weight x10 bil Rt SLS clock taps x 8 rounds - no knee bend for now but can slowly progress to mini single leg squat with reach taps Bil squat to Rt SLS with Lt knee march - option to hold weight Monster walks fwd/bws red loop band HEP progression  11/09/22: Recumbent bike L3 x 4' PT present to discuss status Lt SL pelvic PNF passive and resisted to Rt hemipelvis, followed by addition of Rt LE PNF resisted Strain counterstrain Rt psoas Direct STM Rt psoas, proximal adductors, quads - manual massage and Addaday assisted Trial of prone Rt psoas release on firm pressure release ball Review of slumpy long sitting SLR  11/02/22:  Recumbent bike L3 x 4' PT present to discuss status Standing Rt psoas stretch with heel raise on Rt x 1' Wall plank with knee diagonal march to hip ext x10 each Supine HS, adductor and ITB stretch with strap bil 2x30" each Supine green loop at feet march and extend opp legs 2x16 Long sitting slouched SLR Rt to fatigue 3x, reps ranged from 3-6 Seated slumpy psoas 5x5" holds with breath awareness HEP:  In lunge position with both arms overhead, add dynamic heel raise up/down to hip flexor stretch x 15 heel raises Seated slumpy psoas - 5x 5" holds with breathing - you can add load to this with balanced dumbbell on thigh and do slow motion small range marches 2x10 Long sitting slumpy posture straight leg raises to fatigue  Wall plank pull knee in at diagonal and then extend to active glute x 10 each     PATIENT EDUCATION:  Education details: did not have time to give handouts, start [place] next time Person educated: Patient Education method:  Explanation, Demonstration, Verbal cues, and Handouts Education comprehension: verbalized understanding and returned demonstration   HOME EXERCISE PROGRAM: Access Code: [email or web address] URL: [email or web address] Date: 11/16/2022 Prepared by: Baruch Merl  Exercises - Modified Thomas Stretch  - 1 x daily - 7 x weekly - 2 sets - 2 reps - 30 hold - Supine Lower Trunk Rotation  - 1 x daily - 7 x weekly - 2 sets - 10 reps - Supine Bridge  - 1 x daily - 7 x weekly - 2 sets - 10 reps - Clamshell with Resistance  - 1 x daily - 7 x weekly - 2 sets - 10 reps - Supine Dead Bug with Leg Extension  - 1 x daily - 7 x weekly - 2 sets - 10 reps - Abdominal Bracing  - 1 x daily - 7 x weekly - 2 sets - 10 reps - Side Lunge Adductor Stretch  - 1 x daily - 7 x weekly - 2 sets - 2 reps - 30 hold - Standing Hip Flexor Stretch  - 1 x daily - 7 x weekly - 2 sets - 2 reps - 30 hold - Latissimus Dorsi Stretch at Wall  - 1 x daily - 7 x weekly - 2 sets - 2 reps - 30 hold - Pigeon Pose  - 2 x daily - 7 x weekly - 1 sets - 2 reps - 30 hold - Hooklying Isometric Hip Flexion  - 1 x daily - 7 x weekly - 1 sets - 5 reps - 5 hold - Runner's Climb  - 1 x daily - 7 x weekly - 1 sets - 10 reps - 3 hold - Figure 4 Bridge  - 1 x daily - 7 x weekly - 2 sets - 5 reps - Beginner Front Arm Support  - 1 x daily - 7 x weekly - 2 sets - 5 reps - 3 hold - Single-Leg [place] Deadlift With Kettlebell  - 1 x daily - 7 x weekly - 2 sets - 10 reps - Single Leg Balance with Opposite Leg Star Reach  - 1 x daily - 7 x weekly - 2 sets - 5 reps - Backward Monster Walk with Resistance (BKA)  - 1 x daily - 7 x weekly - 2 sets - 10 reps - Forward Monster Walk with Resistance (BKA)  - 1 x daily - 7 x weekly - 2 sets - 10 reps   ASSESSMENT:   CLINICAL IMPRESSION:  Session focused on loading Rt LE using weights and added resistance on machines to work toward goal of strength gains and improved control of Rt hip and knee.  Pt able to  fully participate with all challenges and grew quite fatigued end of session in gluteals and quads on Rt.  She needed TC/demo and VC to avoid lumbar hinge in deeper phase of squats.  Discussed ways to translate some of machine work to [employer] using tbands.  Continue along POC.   OBJECTIVE IMPAIRMENTS: decreased activity tolerance, decreased balance, difficulty walking, decreased strength, increased muscle spasms, impaired flexibility, improper body mechanics, postural dysfunction, and pain.    ACTIVITY LIMITATIONS: locomotion level   PARTICIPATION LIMITATIONS: community activity and hiking   PERSONAL FACTORS: Time since onset of injury/illness/exacerbation are also affecting patient's functional outcome.    REHAB POTENTIAL: Excellent   CLINICAL DECISION MAKING: Stable/uncomplicated   EVALUATION COMPLEXITY: Low     GOALS: Goals reviewed with patient? Yes   SHORT TERM GOALS: Target date: 10/26/22 Pt will be ind with initial HEP for Rt hip stretches (hip flexor, adductor, piriformis) and gluteal strength Baseline: Goal status: met   2.  Pt will achieve symmetrical pelvis and maintain between visits Baseline:  Goal status: met     LONG TERM GOALS: Target date: 01/25/23   Pt will be ind with advanced HEP so she can maintain hiking distances Baseline:  Goal status: ongoing   2.  Pt will be able to hike up to 8 miles without Rt LE fatigue. Baseline: fatigues at 2 miles Goal status: ongoing   3.  Pt will achieve 5/5 Rt gluteal strength for improved elevation hiking and stairs Baseline:  Goal status: ongoing   4.  Pt will verbalize understanding of psoas and gluteal role in pelvic mobility with gait to help her focus as fatigue sets in on longer hikes.  Baseline:  Goal status: ongoing    5.  Pt will achieve at least 79% on FOTO to demo improved function Baseline: 69%, 79% on 2/22 Goal status: met   PLAN:   PT FREQUENCY: every other week   PT DURATION: 8 weeks   PLANNED  INTERVENTIONS: Therapeutic exercises, Therapeutic activity, Neuromuscular re-education, Balance training, Gait training, Patient/Family education, Self Care, Joint mobilization, Joint manipulation, Dry Needling, Electrical stimulation, Spinal manipulation, Spinal mobilization, Cryotherapy, Moist heat, Taping, Traction, Ionotophoresis '4mg'$ /ml Dexamethasone, and Manual therapy   PLAN FOR NEXT SESSION:  review and progress HEP, continue strength of Rt hip and thigh, core for body mechanics, hip flexor on Rt training, add dead lift with more weight   Katianna Mcclenney, PT 11/29/22 12:29 PM

## 2022-12-14 ENCOUNTER — Encounter: Payer: Self-pay | Admitting: Physical Therapy

## 2022-12-14 ENCOUNTER — Ambulatory Visit: Payer: BC Managed Care – PPO | Admitting: Physical Therapy

## 2022-12-14 DIAGNOSIS — R293 Abnormal posture: Secondary | ICD-10-CM | POA: Diagnosis not present

## 2022-12-14 DIAGNOSIS — R252 Cramp and spasm: Secondary | ICD-10-CM | POA: Diagnosis not present

## 2022-12-14 DIAGNOSIS — M25551 Pain in right hip: Secondary | ICD-10-CM | POA: Diagnosis not present

## 2022-12-14 NOTE — Therapy (Signed)
OUTPATIENT PHYSICAL THERAPY TREATMENT NOTE   Patient Name: Stephanie Walton MRN: [id number] DOB:1971/03/01, 52 y.o., female Today's Date: 12/14/2022   REFERRING PROVIDER: Eunice Blase, MD   END OF SESSION:   PT End of Session - 12/14/22 1358     Visit Number 8    Date for PT Re-Evaluation 01/25/23    Authorization Type BCBS    PT Start Time 1400    PT Stop Time 1445    PT Time Calculation (min) 45 min    Activity Tolerance Patient tolerated treatment well    Behavior During Therapy [facility] for tasks assessed/performed                   History reviewed. No pertinent past medical history. History reviewed. No pertinent surgical history. 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    PAIN:  Are you having pain? Yes NPRS scale: 0/10 Pain location: Rt proximal thigh anterior and lateral, a line from lateral hip to medial knee Pain orientation: Right  PAIN TYPE: aching and weak/fatigue Pain description: aching and fatigue   Aggravating factors: hiking and walking at mile 2, laying on Rt side Relieving factors: stretching  OBJECTIVE:    DIAGNOSTIC FINDINGS: n/a   PATIENT SURVEYS:  FOTO 11/16/22: 79% met goal FOTO 69% goal 79%   COGNITION: Overall cognitive status: Within functional limits for tasks assessed                          SENSATION: WFL     MUSCLE LENGTH: 2/22:  THOMAS TEST: + on Rt for ITB tightness  Hamstrings WNL bil Rt adductors, hip flexors tight compared to Lt + Thomas test for ITB tightness bil   POSTURE:  2/22: symmetrical pelvis  pelvic asymmetry present: Rt inflare    PALPATION:  Rt psoas, adductors, glut min, glut med, piriformis tender to palpation Significant tone in Rt adductors Atrophy noted in  Rt glut min, med, max compared to Lt   LOWER EXTREMITY ROM: WNL bil    LOWER EXTREMITY MMT:   MMT Right eval Left eval RIGHT 2/22 LEFT 2/22  Hip flexion 4+ 5 4+ 5  Hip extension 4 5 4 5   Hip abduction 4 4+ 4 4+  Hip adduction 4+ 5 4+ 5  Hip internal rotation     4+ 5  Hip external rotation 4 4+ 4 4+  Knee flexion 5 5 5 5   Knee extension 5 5 5 5   Ankle dorsiflexion        Ankle plantarflexion        Ankle inversion        Ankle eversion         (Blank rows = not tested)       FUNCTIONAL TESTS:  2/22: Single leg squat: on Rt knee valgus occurs, more challenges with SLS balance than on Lt  Pt able to balance in SLS bil , functional squat WNL Pt gets audible snapping of hip flexor tendon on Rt with ASLR lowering phase   GAIT: Distance walked: within clinic Assistive device utilized: None Level of assistance: Complete Independence Comments: no signif gait deviations, suspect psoas inhibition on Rt     TODAY'S TREATMENT:                                                                                                                              DATE:  12/14/22: Recumbent bike L3 x 4' PT present to discuss status Manual therapy: prone PA joint mobs Gr II-IV throughout mid and lower thoracic and lumbar spine, STM broadening stretch to QL, lat at attachment to pelvis Sidelying "X" top arm and leg reach away at diagonal for elongation of QL and obliques bil Open books with focus on more lower thoracic mobility "Sassy walk" with emphasis on dissociated movement of  pelvis and ribcage  11/29/22: Recumbent bike L3 x 4' PT present to discuss status Rt psoas stretch with Lt SB and overhead reach bil 1x20" Squat with 10lb chest press 2 x 10 Squat to march onto Rt LE hold 2x 10 10lb Slider lunge on Rt LE x10 lateral and bwd each Stagger stance lat row for opp lat and glut 2x10 10lb Squat with low cable pulley bil row on stand 10lb 2 x 10 Hip matrix 25lb 2x10 each abd/ext/flexion Slumpy psoas 5x10" holds (march isometric from slumped posture to upright posture) Leg press seat 6 70lb bil LE  2x10, Rt LE only 35lb 2x10 Mountain climbers on sliders in chair plank to fatigue x 2 sets  11/16/22: Standing T with 10lb weight x10 bil Rt SLS clock taps x 8 rounds - no knee bend for now but can slowly progress to mini single leg squat with reach taps Bil squat to Rt SLS with Lt knee march - option to hold weight Monster walks fwd/bws red loop band HEP progression  11/09/22: Recumbent bike  L3 x 4' PT present to discuss status Lt SL pelvic PNF passive and resisted to Rt hemipelvis, followed by addition of Rt LE PNF resisted Strain counterstrain Rt psoas Direct STM Rt psoas, proximal adductors, quads - manual massage and Addaday assisted Trial of prone Rt psoas release on firm pressure release ball Review of slumpy long sitting SLR  11/02/22: Recumbent bike L3 x 4' PT present to discuss status Standing Rt psoas stretch with heel raise on Rt x 1' Wall plank with knee diagonal march to hip ext x10 each Supine HS, adductor and ITB stretch with strap bil 2x30" each Supine green loop at feet march and extend opp legs 2x16 Long sitting slouched SLR Rt to fatigue 3x, reps ranged from 3-6 Seated slumpy psoas 5x5" holds with breath awareness HEP:  In lunge position with both arms overhead, add dynamic heel raise up/down to hip flexor stretch x 15 heel raises Seated slumpy psoas - 5x 5" holds with breathing - you can add load to this with balanced dumbbell on thigh and do  slow motion small range marches 2x10 Long sitting slumpy posture straight leg raises to fatigue  Wall plank pull knee in at diagonal and then extend to active glute x 10 each     PATIENT EDUCATION:  Education details: did not have time to give handouts, start [place] next time Person educated: Patient Education method: Explanation, Demonstration, Verbal cues, and Handouts Education comprehension: verbalized understanding and returned demonstration   HOME EXERCISE PROGRAM: Access Code: [email or web address] URL: [email or web address] Date: 11/16/2022 Prepared by: Baruch Merl  Exercises - Modified Thomas Stretch  - 1 x daily - 7 x weekly - 2 sets - 2 reps - 30 hold - Supine Lower Trunk Rotation  - 1 x daily - 7 x weekly - 2 sets - 10 reps - Supine Bridge  - 1 x daily - 7 x weekly - 2 sets - 10 reps - Clamshell with Resistance  - 1 x daily - 7 x weekly - 2 sets - 10 reps - Supine Dead Bug with Leg Extension  - 1 x daily - 7 x weekly - 2 sets - 10 reps - Abdominal Bracing  - 1 x daily - 7 x weekly - 2 sets - 10 reps - Side Lunge Adductor Stretch  - 1 x daily - 7 x weekly - 2 sets - 2 reps - 30 hold - Standing Hip Flexor Stretch  - 1 x daily - 7 x weekly - 2 sets - 2 reps - 30 hold - Latissimus Dorsi Stretch at Wall  - 1 x daily - 7 x weekly - 2 sets - 2 reps - 30 hold - Pigeon Pose  - 2 x daily - 7 x weekly - 1 sets - 2 reps - 30 hold - Hooklying Isometric Hip Flexion  - 1 x daily - 7 x weekly - 1 sets - 5 reps - 5 hold - Runner's Climb  - 1 x daily - 7 x weekly - 1 sets - 10 reps - 3 hold - Figure 4 Bridge  - 1 x daily - 7 x weekly - 2 sets - 5 reps - Beginner Front Arm Support  - 1 x daily - 7 x weekly - 2 sets - 5 reps - 3 hold - Single-Leg [place] Deadlift With Kettlebell  - 1 x daily - 7 x weekly - 2 sets - 10 reps - Single Leg Balance with Opposite Leg Star Reach  - 1 x  daily - 7 x weekly - 2 sets - 5 reps - Backward Monster Walk with Resistance (BKA)  - 1 x daily - 7 x  weekly - 2 sets - 10 reps - Forward Monster Walk with Resistance (BKA)  - 1 x daily - 7 x weekly - 2 sets - 10 reps   ASSESSMENT:   CLINICAL IMPRESSION: Pt continues to hit wall of fatigue between 2 and 3 miles of walking.  Session focused today on joint and soft tissue mobility of thoracic and lumbar spine to allow for improved mechanics of gait and dissociation of movement of ribcage and pelvis within gait.  Pt is very tight in Rt obliques and QL compared to Lt which appears to be contributing to a pelvic torsion affecting symmetry.  Continue along POC with ongoing focus of Rt LE strength training between visits with HEP.   OBJECTIVE IMPAIRMENTS: decreased activity tolerance, decreased balance, difficulty walking, decreased strength, increased muscle spasms, impaired flexibility, improper body mechanics, postural dysfunction, and pain.    ACTIVITY LIMITATIONS: locomotion level   PARTICIPATION LIMITATIONS: community activity and hiking   PERSONAL FACTORS: Time since onset of injury/illness/exacerbation are also affecting patient's functional outcome.    REHAB POTENTIAL: Excellent   CLINICAL DECISION MAKING: Stable/uncomplicated   EVALUATION COMPLEXITY: Low     GOALS: Goals reviewed with patient? Yes   SHORT TERM GOALS: Target date: 10/26/22 Pt will be ind with initial HEP for Rt hip stretches (hip flexor, adductor, piriformis) and gluteal strength Baseline: Goal status: met   2.  Pt will achieve symmetrical pelvis and maintain between visits Baseline:  Goal status: met     LONG TERM GOALS: Target date: 01/25/23   Pt will be ind with advanced HEP so she can maintain hiking distances Baseline:  Goal status: ongoing   2.  Pt will be able to hike up to 8 miles without Rt LE fatigue. Baseline: fatigues at 2 miles Goal status: ongoing   3.  Pt will achieve 5/5 Rt gluteal strength for improved elevation hiking and stairs Baseline:  Goal status: ongoing   4.  Pt will verbalize  understanding of psoas and gluteal role in pelvic mobility with gait to help her focus as fatigue sets in on longer hikes.  Baseline:  Goal status: ongoing    5.  Pt will achieve at least 79% on FOTO to demo improved function Baseline: 69%, 79% on 2/22 Goal status: met   PLAN:   PT FREQUENCY: every other week   PT DURATION: 8 weeks   PLANNED INTERVENTIONS: Therapeutic exercises, Therapeutic activity, Neuromuscular re-education, Balance training, Gait training, Patient/Family education, Self Care, Joint mobilization, Joint manipulation, Dry Needling, Electrical stimulation, Spinal manipulation, Spinal mobilization, Cryotherapy, Moist heat, Taping, Traction, Ionotophoresis 4mg /ml Dexamethasone, and Manual therapy   PLAN FOR NEXT SESSION:  reassess sidelying X stretch, continue lumbar/thoracic mobs and STM to Rt obliques/QL, review and progress HEP, continue strength of Rt hip and thigh, core for body mechanics, hip flexor on Rt training, add dead lift with more weight   Ahsha Hinsley, PT 12/14/22 3:37 PM

## 2022-12-27 ENCOUNTER — Encounter: Payer: Self-pay | Admitting: Physical Therapy

## 2022-12-27 ENCOUNTER — Ambulatory Visit: Payer: BC Managed Care – PPO | Attending: Family Medicine | Admitting: Physical Therapy

## 2022-12-27 DIAGNOSIS — R293 Abnormal posture: Secondary | ICD-10-CM | POA: Diagnosis not present

## 2022-12-27 DIAGNOSIS — M25551 Pain in right hip: Secondary | ICD-10-CM | POA: Diagnosis not present

## 2022-12-27 DIAGNOSIS — R252 Cramp and spasm: Secondary | ICD-10-CM | POA: Insufficient documentation

## 2022-12-27 NOTE — Therapy (Signed)
OUTPATIENT PHYSICAL THERAPY TREATMENT NOTE   Patient Name: Stephanie Walton MRN: [id number] DOB:11-30-1970, 52 y.o., female Today's Date: 12/27/2022   REFERRING PROVIDER: Eunice Blase, MD   END OF SESSION:   PT End of Session - 12/27/22 1108     Visit Number 9    Date for PT Re-Evaluation 01/25/23    Authorization Type BCBS    PT Start Time 1105    PT Stop Time 1143    PT Time Calculation (min) 38 min    Activity Tolerance Patient tolerated treatment well    Behavior During Therapy [facility] for tasks assessed/performed                    History reviewed. No pertinent past medical history. History reviewed. No pertinent surgical history. 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  I walked 2[place]7 miles [place] the beach one day and that felt good but the next day I was so too weak to walk normally.  PAIN:  Are you having pain? Yes NPRS scale: 0/10 Pain location: Rt proximal thigh anterior and lateral, a line from lateral hip to medial knee Pain orientation: Right  PAIN TYPE: aching and weak/fatigue Pain description: aching and fatigue   Aggravating factors: hiking and walking at mile 2, laying on Rt side Relieving factors: stretching  OBJECTIVE:    DIAGNOSTIC FINDINGS: n/a   PATIENT SURVEYS:  FOTO 11/16/22: 79% met goal FOTO 69% goal 79%    COGNITION: Overall cognitive status: Within functional limits for tasks assessed                         SENSATION: [facility]     MUSCLE LENGTH: 2/22:  THOMAS TEST: + on Rt for ITB tightness  Hamstrings WNL bil Rt adductors, hip flexors tight compared to Lt + Thomas test for ITB tightness bil   POSTURE:  2/22: symmetrical pelvis  pelvic asymmetry present: Rt inflare  PALPATION:  Rt psoas, adductors, glut min, glut med, piriformis tender to palpation Significant tone in Rt adductors Atrophy noted in Rt glut min, med, max compared to Lt   LOWER EXTREMITY ROM: WNL bil    LOWER EXTREMITY MMT:   MMT Right eval Left eval RIGHT 2/22 LEFT 2/22  Hip flexion 4+ 5 4+ 5  Hip extension 4 5 4 5   Hip abduction 4 4+ 4 4+  Hip adduction 4+ 5 4+ 5  Hip internal rotation     4+ 5  Hip external rotation 4 4+ 4 4+  Knee flexion 5 5 5 5   Knee extension 5 5 5 5   Ankle dorsiflexion        Ankle plantarflexion        Ankle inversion        Ankle eversion         (Blank rows = not tested)       FUNCTIONAL TESTS:  2/22: Single leg squat: on Rt knee valgus occurs, more challenges with SLS balance than on Lt  Pt able to balance in SLS bil , functional squat WNL Pt gets audible snapping of hip flexor tendon on Rt with ASLR lowering phase   GAIT: Distance walked: within clinic Assistive device utilized: None Level of assistance: Complete Independence Comments: no signif gait deviations, suspect psoas inhibition on Rt     TODAY'S TREATMENT:                                                                                                                              DATE:  12/27/22: Recumbent bike L5 x 3' PT present to discuss status Circuit x1 rounds: 15lb deadlift, 15lb squat to march x10 each LE, side lunge back lunge holding 10lb x5 each Sidebend hold 15lb at side x10 each  Plank hands on mat table 4lb ankle weights slow motion mountain climber knee driver alt LE [id number]" step up  with knee driver 4lb ankle weight hold 5lb pull to knee Hip ext standing 4lb ankle weights Resisted Rt hip abd testing repeated to look for fatiguable weakness - not present Discussion of likely causes of ongoing fatigue/weakness with active days/hiking  12/14/22: Recumbent bike L3 x 4' PT present to discuss status Manual therapy: prone PA joint mobs Gr II-IV throughout mid and lower thoracic and lumbar spine, STM broadening stretch to QL, lat at attachment to pelvis Sidelying "X" top arm and leg reach away at diagonal for elongation of QL and obliques bil Open books with focus on more lower thoracic mobility "Sassy walk" with emphasis on dissociated movement of pelvis and ribcage  11/29/22: Recumbent bike L3 x 4' PT present to discuss status Rt psoas stretch with Lt SB and overhead reach bil 1x20" Squat with 10lb chest press 2 x 10 Squat to march onto Rt LE hold 2x 10 10lb Slider lunge on Rt LE x10 lateral and bwd each Stagger stance lat row for opp  lat and glut 2x10 10lb Squat with low cable pulley bil row on stand 10lb 2 x 10 Hip matrix 25lb 2x10 each abd/ext/flexion Slumpy psoas 5x10" holds (march isometric from slumped posture to upright posture) Leg press seat 6 70lb bil LE  2x10, Rt LE only 35lb 2x10 Mountain climbers on sliders in chair plank to fatigue x 2 sets   HEP:  In lunge position with both arms overhead, add dynamic heel raise up/down to hip flexor stretch x 15 heel raises Seated slumpy psoas - 5x 5" holds with breathing - you can add load to this with balanced dumbbell on thigh and do slow motion small range marches 2x10 Long sitting slumpy posture straight leg raises to fatigue  Wall plank pull knee in at diagonal and then extend to active glute x 10 each     PATIENT EDUCATION:  Education details: did not have time to give handouts, start [place] next time Person educated: Patient Education method: Explanation, Demonstration, Verbal cues, and Handouts Education  comprehension: verbalized understanding and returned demonstration   HOME EXERCISE PROGRAM: Access Code: [email or web address] URL: [email or web address] Date: 11/16/2022 Prepared by: Baruch Merl  Exercises - Modified Thomas Stretch  - 1 x daily - 7 x weekly - 2 sets - 2 reps - 30 hold - Supine Lower Trunk Rotation  - 1 x daily - 7 x weekly - 2 sets - 10 reps - Supine Bridge  - 1 x daily - 7 x weekly - 2 sets - 10 reps - Clamshell with Resistance  - 1 x daily - 7 x weekly - 2 sets - 10 reps - Supine Dead Bug with Leg Extension  - 1 x daily - 7 x weekly - 2 sets - 10 reps - Abdominal Bracing  - 1 x daily - 7 x weekly - 2 sets - 10 reps - Side Lunge Adductor Stretch  - 1 x daily - 7 x weekly - 2 sets - 2 reps - 30 hold - Standing Hip Flexor Stretch  - 1 x daily - 7 x weekly - 2 sets - 2 reps - 30 hold - Latissimus Dorsi Stretch at Wall  - 1 x daily - 7 x weekly - 2 sets - 2 reps - 30 hold - Pigeon Pose  - 2 x daily - 7 x weekly - 1 sets - 2 reps - 30 hold - Hooklying Isometric Hip Flexion  - 1 x daily - 7 x weekly - 1 sets - 5 reps - 5 hold - Runner's Climb  - 1 x daily - 7 x weekly - 1 sets - 10 reps - 3 hold - Figure 4 Bridge  - 1 x daily - 7 x weekly - 2 sets - 5 reps - Beginner Front Arm Support  - 1 x daily - 7 x weekly - 2 sets - 5 reps - 3 hold - Single-Leg [place] Deadlift With Kettlebell  - 1 x daily - 7 x weekly - 2 sets - 10 reps - Single Leg Balance with Opposite Leg Star Reach  - 1 x daily - 7 x weekly - 2 sets - 5 reps - Backward Monster Walk with Resistance (BKA)  - 1 x daily - 7 x weekly - 2 sets - 10 reps - Forward Monster Walk with Resistance (BKA)  - 1 x daily - 7 x weekly - 2 sets - 10 reps   ASSESSMENT:   CLINICAL IMPRESSION: Pt continues to hit wall of fatigue between 2 and  3 miles of walking.  She does not have any pain, just weakness.  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  She has greater challenge  stabilizing through Rt LE with loading especially into dynamic SLS challenges.  Will work on finalizing HEP over 2 more visits and d/c to HEP at that time.     OBJECTIVE IMPAIRMENTS: decreased activity tolerance, decreased balance, difficulty walking, decreased strength, increased muscle spasms, impaired flexibility, improper body mechanics, postural dysfunction, and pain.    ACTIVITY LIMITATIONS: locomotion level   PARTICIPATION LIMITATIONS: community activity and hiking   PERSONAL FACTORS: Time since onset of injury/illness/exacerbation are also affecting patient's functional outcome.    REHAB POTENTIAL: Excellent   CLINICAL DECISION MAKING: Stable/uncomplicated   EVALUATION COMPLEXITY: Low     GOALS: Goals reviewed with patient? Yes   SHORT TERM GOALS: Target date: 10/26/22 Pt will be ind with initial HEP for Rt hip stretches (hip flexor, adductor, piriformis) and gluteal strength Baseline: Goal status: met   2.  Pt will achieve symmetrical pelvis and maintain between visits Baseline:  Goal status: met     LONG TERM GOALS: Target date: 01/25/23   Pt will be ind with advanced HEP so she can maintain hiking distances Baseline:  Goal status: ongoing   2.  Pt will be able to hike up to 8 miles without Rt LE fatigue. Baseline: fatigues at 2 miles Goal status: ongoing   3.  Pt will achieve 5/5 Rt gluteal strength for improved elevation hiking and stairs Baseline:  Goal status: ongoing   4.  Pt will verbalize understanding of psoas and gluteal role in pelvic mobility with gait to help her focus as fatigue sets in on longer hikes.  Baseline:  Goal status: ongoing    5.  Pt will achieve at least 79% on FOTO to demo improved function Baseline: 69%, 79% on 2/22 Goal status: met   PLAN:   PT FREQUENCY: every other week   PT DURATION: 8 weeks   PLANNED INTERVENTIONS: Therapeutic exercises, Therapeutic activity, Neuromuscular re-education, Balance training, Gait training,  Patient/Family education, Self Care, Joint mobilization, Joint manipulation, Dry Needling, Electrical stimulation, Spinal manipulation, Spinal mobilization, Cryotherapy, Moist heat, Taping, Traction, Ionotophoresis 4mg /ml Dexamethasone, and Manual therapy   PLAN FOR NEXT SESSION:  review and progress HEP, continue strength of Rt hip and thigh, core for body mechanics, hip flexor on Rt training, add dead lift with more weight   Jerman Tinnon, PT 12/27/22 1:39 PM

## 2023-01-11 ENCOUNTER — Ambulatory Visit: Payer: BC Managed Care – PPO | Admitting: Physical Therapy

## 2023-01-11 ENCOUNTER — Encounter: Payer: BC Managed Care – PPO | Admitting: Physical Therapy

## 2023-01-11 ENCOUNTER — Encounter: Payer: Self-pay | Admitting: Physical Therapy

## 2023-01-11 DIAGNOSIS — R252 Cramp and spasm: Secondary | ICD-10-CM

## 2023-01-11 DIAGNOSIS — R293 Abnormal posture: Secondary | ICD-10-CM | POA: Diagnosis not present

## 2023-01-11 DIAGNOSIS — M25551 Pain in right hip: Secondary | ICD-10-CM

## 2023-01-11 NOTE — Therapy (Signed)
OUTPATIENT PHYSICAL THERAPY TREATMENT NOTE   Patient Name: Stephanie Walton MRN: [id number] DOB:December 19, 1970, 52 y.o., female Today's Date: 01/11/2023   REFERRING PROVIDER: Lavada Mesi, MD   END OF SESSION:   PT End of Session - 01/11/23 1148     Visit Number 10    Date for PT Re-Evaluation 01/25/23    Authorization Type BCBS    PT Start Time 1145    PT Stop Time 1225    PT Time Calculation (min) 40 min    Activity Tolerance Patient tolerated treatment well    Behavior During Therapy [facility] for tasks assessed/performed                     History reviewed. No pertinent past medical history. History reviewed. No pertinent surgical history. 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   I got very stiff while traveling on planes.  PAIN:  Are you having pain? Yes NPRS scale: 0/10 Pain location: Rt proximal thigh anterior and lateral, a line from lateral hip to medial knee Pain orientation: Right  PAIN TYPE: aching and weak/fatigue Pain description: aching and fatigue   Aggravating factors: hiking and walking at mile 2, laying on Rt side Relieving factors: stretching  OBJECTIVE:    DIAGNOSTIC FINDINGS: n/a   PATIENT SURVEYS:  FOTO 11/16/22: 79% met goal FOTO 69% goal 79%    COGNITION: Overall cognitive status: Within functional limits for tasks assessed                         SENSATION: [facility]     MUSCLE LENGTH: 2/22:  THOMAS TEST: + on Rt for ITB tightness  Hamstrings WNL bil Rt adductors, hip flexors tight compared to Lt + Thomas test for ITB tightness bil   POSTURE:  2/22: symmetrical pelvis  pelvic asymmetry present: Rt inflare  PALPATION:  Rt psoas, adductors, glut min, glut med, piriformis tender to palpation Significant tone in Rt adductors Atrophy noted in Rt glut min, med, max compared to Lt   LOWER EXTREMITY ROM: WNL bil    LOWER EXTREMITY MMT:   MMT Right eval Left eval RIGHT 2/22 LEFT 2/22  Hip flexion 4+ 5 4+ 5  Hip extension 4 5 4 5   Hip abduction 4 4+ 4 4+  Hip adduction 4+ 5 4+ 5  Hip internal rotation     4+ 5  Hip external rotation 4 4+ 4 4+  Knee flexion 5 5 5 5   Knee extension 5 5 5 5   Ankle dorsiflexion        Ankle plantarflexion        Ankle inversion        Ankle eversion         (Blank rows = not tested)       FUNCTIONAL TESTS:  2/22: Single leg squat: on Rt knee valgus occurs, more challenges with SLS balance than on Lt  Pt able to balance in SLS bil , functional squat WNL Pt gets audible snapping of hip flexor tendon on Rt with ASLR lowering phase   GAIT: Distance walked: within clinic Assistive device utilized: None Level of assistance: Complete [place] Comments: no signif gait deviations, suspect psoas inhibition on Rt     TODAY'S TREATMENT:                                                                                                                              DATE:  01/11/23: Recumbent bike L4 x 5' PT present to discuss status Hip flexor stretch foot on 2nd step reach up/across x 30" bil Dynamic hip flexor stretch with heel raise bil UE overhead x 10 heel raises  Sidebend hold 15lb at side x10 each  March x 5, then squat march x10 each LE, hold 10lb  6" step up with knee  driver 4lb ankle weight hold 5lb pull to knee x 10 reps  Hip ext 4lb ankle weight 2x10 bil Plank hands on mat table 4lb ankle weights slow motion mountain climber knee driver alt LE x5 Chair plank feet on sliders mountain climbers x 20 SL hip abd 4lb ankle weights x10 each LE Quadruped on elbows hip extension rainbow 4lb ankle weight x8 reps [place] stretch x60" bil Standing SB with overhead reach stretch x 30" bil  12/27/22: Recumbent bike L5 x 3' PT present to discuss status Circuit x1 rounds: 15lb deadlift, 15lb squat to march x10 each LE, side lunge back lunge holding 10lb x5 each Sidebend hold 15lb at side x10 each  Plank hands on mat table 4lb ankle weights slow motion mountain climber knee driver alt LE [id number]" step up with knee driver 4lb ankle weight hold 5lb pull to knee Hip ext standing 4lb ankle weights Resisted Rt hip abd testing repeated to look for fatiguable weakness - not  present Discussion of likely causes of ongoing fatigue/weakness with active days/hiking  12/14/22: Recumbent bike L3 x 4' PT present to discuss status Manual therapy: prone PA joint mobs Gr II-IV throughout mid and lower thoracic and lumbar spine, STM broadening stretch to QL, lat at attachment to pelvis Sidelying "X" top arm and leg reach away at diagonal for elongation of QL and obliques bil Open books with focus on more lower thoracic mobility "Sassy walk" with emphasis on dissociated movement of pelvis and ribcage  HEP:  In lunge position with both arms overhead, add dynamic heel raise up/down to hip flexor stretch x 15 heel raises Seated slumpy psoas - 5x 5" holds with breathing - you can add load to this with balanced dumbbell on thigh and do slow motion small range marches 2x10 Long sitting slumpy posture straight leg raises to fatigue  Wall plank pull knee in at diagonal and then extend to active glute x 10 each     PATIENT EDUCATION:  Education details: did not have time to give handouts,  start [employer] next time Person educated: Patient Education method: Explanation, Demonstration, Verbal cues, and Handouts Education comprehension: verbalized understanding and returned demonstration   HOME EXERCISE PROGRAM: Access Code: [email or web address] URL: [email or web address] Date: 11/16/2022 Prepared by: Morton Peters  Exercises - Modified Thomas Stretch  - 1 x daily - 7 x weekly - 2 sets - 2 reps - 30 hold - Supine Lower Trunk Rotation  - 1 x daily - 7 x weekly - 2 sets - 10 reps - Supine Bridge  - 1 x daily - 7 x weekly - 2 sets - 10 reps - Clamshell with Resistance  - 1 x daily - 7 x weekly - 2 sets - 10 reps - Supine Dead Bug with Leg Extension  - 1 x daily - 7 x weekly - 2 sets - 10 reps - Abdominal Bracing  - 1 x daily - 7 x weekly - 2 sets - 10 reps - Side Lunge Adductor Stretch  - 1 x daily - 7 x weekly - 2 sets - 2 reps - 30 hold - Standing Hip Flexor Stretch  - 1 x daily - 7 x weekly - 2 sets - 2 reps - 30 hold - Latissimus Dorsi Stretch at Wall  - 1 x daily - 7 x weekly - 2 sets - 2 reps - 30 hold - Pigeon Pose  - 2 x daily - 7 x weekly - 1 sets - 2 reps - 30 hold - Hooklying Isometric Hip Flexion  - 1 x daily - 7 x weekly - 1 sets - 5 reps - 5 hold - Runner's Climb  - 1 x daily - 7 x weekly - 1 sets - 10 reps - 3 hold - Figure 4 Bridge  - 1 x daily - 7 x weekly - 2 sets - 5 reps - Beginner Front Arm Support  - 1 x daily - 7 x weekly - 2 sets - 5 reps - 3 hold - Single-Leg [place] Deadlift With Kettlebell  - 1 x daily - 7 x weekly - 2 sets - 10 reps - Single Leg Balance with Opposite Leg Star Reach  - 1 x daily - 7 x weekly - 2 sets - 5 reps - Backward Monster Walk with Resistance (BKA)  - 1 x daily - 7 x weekly - 2 sets - 10 reps - Forward Monster Walk with Resistance (BKA)  - 1 x daily - 7 x weekly -  2 sets - 10 reps   ASSESSMENT:   CLINICAL IMPRESSION: Pt was able to walk 7 miles with breaks on level ground (city walking) in [place] and participated in a 4  hour dance workshop.  She felt loading the LE at last visit was very helpful and she is going to purchase ankle weights to work on adding to [employer].  She continues to display asymmetry in strength with weakness and trouble coorindating higher level challenges through Rt proximal LE.  Session focused on loading weak muscles and linking trunk to pelvis/pelvis to hip. ERO next visit with plan to taper to 1x/mo as Pt becomes more ind with HEP.   OBJECTIVE IMPAIRMENTS: decreased activity tolerance, decreased balance, difficulty walking, decreased strength, increased muscle spasms, impaired flexibility, improper body mechanics, postural dysfunction, and pain.    ACTIVITY LIMITATIONS: locomotion level   PARTICIPATION LIMITATIONS: community activity and hiking   PERSONAL FACTORS: Time since onset of injury/illness/exacerbation are also affecting patient's functional outcome.    REHAB POTENTIAL: Excellent   CLINICAL DECISION MAKING: Stable/uncomplicated   EVALUATION COMPLEXITY: Low     GOALS: Goals reviewed with patient? Yes   SHORT TERM GOALS: Target date: 10/26/22 Pt will be ind with initial HEP for Rt hip stretches (hip flexor, adductor, piriformis) and gluteal strength Baseline: Goal status: met   2.  Pt will achieve symmetrical pelvis and maintain between visits Baseline:  Goal status: met     LONG TERM GOALS: Target date: 01/25/23   Pt will be ind with advanced HEP so she can maintain hiking distances Baseline:  Goal status: ongoing   2.  Pt will be able to hike up to 8 miles without Rt LE fatigue. Baseline: fatigues at 2 miles Goal status: ongoing   3.  Pt will achieve 5/5 Rt gluteal strength for improved elevation hiking and stairs Baseline:  Goal status: ongoing   4.  Pt will verbalize understanding of psoas and gluteal role in pelvic mobility with gait to help her focus as fatigue sets in on longer hikes.  Baseline:  Goal status: ongoing    5.  Pt will achieve at least 79%  on FOTO to demo improved function Baseline: 69%, 79% on 2/22 Goal status: met   PLAN:   PT FREQUENCY: every other week   PT DURATION: 8 weeks   PLANNED INTERVENTIONS: Therapeutic exercises, Therapeutic activity, Neuromuscular re-education, Balance training, Gait training, Patient/Family education, Self Care, Joint mobilization, Joint manipulation, Dry Needling, Electrical stimulation, Spinal manipulation, Spinal mobilization, Cryotherapy, Moist heat, Taping, Traction, Ionotophoresis /ml Dexamethasone, and Manual therapy   PLAN FOR NEXT SESSION:  ERO with plan to taper to 1x/mo, review and progress HEP, continue strength of Rt hip and thigh, core for body mechanics, hip flexor on Rt training, add dead lift with more weight   Alphonza Tramell, PT 01/11/23 12:30 PM

## 2023-01-17 ENCOUNTER — Encounter: Payer: Self-pay | Admitting: Physical Therapy

## 2023-01-17 ENCOUNTER — Ambulatory Visit: Payer: BC Managed Care – PPO | Admitting: Physical Therapy

## 2023-01-17 DIAGNOSIS — R252 Cramp and spasm: Secondary | ICD-10-CM

## 2023-01-17 DIAGNOSIS — M25551 Pain in right hip: Secondary | ICD-10-CM

## 2023-01-17 DIAGNOSIS — R293 Abnormal posture: Secondary | ICD-10-CM

## 2023-01-17 NOTE — Therapy (Signed)
OUTPATIENT PHYSICAL THERAPY TREATMENT NOTE   Patient Name: Stephanie Walton MRN: [id number] DOB:11-22-1970, 52 y.o., female Today's Date: 01/17/2023   REFERRING PROVIDER: Lavada Mesi, MD   END OF SESSION:   PT End of Session - 01/17/23 1104     Visit Number 11    Date for PT Re-Evaluation 04/11/23    Authorization Type BCBS    PT Start Time 1100    PT Stop Time 1140    PT Time Calculation (min) 40 min    Activity Tolerance Patient tolerated treatment well    Behavior During Therapy [facility] for tasks assessed/performed                      History reviewed. No pertinent past medical history. History reviewed. No pertinent surgical history. 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  I didn't ride as fast as I used to be able to but overall a good ride.  Overall I feel like I'm 75% better with regards to my goals.    PAIN:  Are you having pain? Yes NPRS scale: 0/10 Pain location: Rt proximal thigh anterior and lateral, a line from lateral hip to medial knee Pain orientation: Right  PAIN TYPE: aching and weak/fatigue Pain description: aching and fatigue   Aggravating factors: hiking and walking at mile 2, laying on Rt side Relieving factors: stretching  OBJECTIVE:    DIAGNOSTIC FINDINGS: n/a   PATIENT SURVEYS:  FOTO 11/16/22:  79% met goal FOTO 69% goal 79%   COGNITION: Overall cognitive status: Within functional limits for tasks assessed                         SENSATION: WFL     MUSCLE LENGTH: 2/22:  THOMAS TEST: + on Rt for ITB tightness  Hamstrings WNL bil Rt adductors, hip flexors tight compared to Lt + Thomas test for ITB tightness bil   POSTURE:  2/22: symmetrical pelvis  pelvic asymmetry present: Rt inflare    PALPATION:  Rt psoas, adductors, glut min, glut med, piriformis tender to palpation Significant tone in Rt adductors Atrophy noted in Rt glut min, med, max compared to Lt   LOWER EXTREMITY ROM: WNL bil    LOWER EXTREMITY MMT:   MMT Right eval Left eval RIGHT 2/22 RIGHT 4/24 LEFT 2/22  Hip flexion 4+ 5 4+ 4+ 5  Hip extension 4 5 4  4+ 5  Hip abduction 4 4+ 4 4 4+  Hip adduction 4+ 5 4+ 4+ 5  Hip internal rotation     4+ 4+ 5  Hip external rotation 4 4+ 4 4 4+  Knee flexion 5 5 5 5 5   Knee extension 5 5 5 5 5   Ankle dorsiflexion         Ankle plantarflexion         Ankle inversion         Ankle eversion          (Blank rows = not tested)       FUNCTIONAL TESTS:  4/24:   2/22: Single leg squat: on Rt knee valgus occurs, more challenges with SLS balance than on Lt  Pt able to balance in SLS bil , functional squat WNL Pt gets audible snapping of hip flexor tendon on Rt with ASLR lowering phase   GAIT: Distance walked: within clinic Assistive device utilized: None Level of assistance: Complete Independence Comments: no signif gait deviations, suspect psoas inhibition on Rt     TODAY'S TREATMENT:                                                                                                                              DATE:  01/17/23: Recumbent bike L4 x 7' PT present to discuss status Hip flexor stretch foot on 2nd step reach up/across x 30" bil Dynamic hip flexor stretch with heel raise bil UE overhead x 10 heel raises  Sidebend hold 15lb at side x10  each  March x 5, then squat march x10 each LE, hold 10lb - focus more on power/speed for Type II fibers Bulgarian squat hold 10lb x 8 reps each bil - focus more on power/speed for Type II fibers Standing T at counter 10lb kbell x 10 - focus more on power/speed for Type II fibers SL Rt hip rainbow/mountain taps x 8, small circle and big circle x 5 each way  01/11/23: Recumbent bike L4 x 5' PT present to discuss status Hip flexor stretch foot on 2nd step reach up/across x 30" bil Dynamic hip flexor stretch with heel raise bil UE overhead x 10 heel raises  Sidebend hold 15lb at side x10 each  March x 5, then squat march x10 each LE, hold 10lb  6" step up with knee driver 4lb ankle weight hold 5lb pull to knee x 10 reps  Hip ext 4lb ankle weight 2x10 bil Plank hands  on mat table 4lb ankle weights slow motion mountain climber knee driver alt LE x5 Chair plank feet on sliders mountain climbers x 20 SL hip abd 4lb ankle weights x10 each LE Quadruped on elbows hip extension rainbow 4lb ankle weight x8 reps Pigeon stretch x60" bil Standing SB with overhead reach stretch x 30" bil  12/27/22: Recumbent bike L5 x 3' PT present to discuss status Circuit x1 rounds: 15lb deadlift, 15lb squat to march x10 each LE, side lunge back lunge holding 10lb x5 each Sidebend hold 15lb at side x10 each  Plank hands on mat table 4lb ankle weights slow motion mountain climber knee driver alt LE [id number] 6" step up with knee driver 4lb ankle weight hold 5lb pull to knee Hip ext standing 4lb ankle weights Resisted Rt hip abd testing repeated to look for fatiguable weakness - not present Discussion of likely causes of ongoing fatigue/weakness with active days/hiking   HEP:  In lunge position with both arms overhead, add dynamic heel raise up/down to hip flexor stretch x 15 heel raises Seated slumpy psoas - 5x 5" holds with breathing - you can add load to this with balanced dumbbell on thigh and do slow motion small range  marches 2x10 Long sitting slumpy posture straight leg raises to fatigue  Wall plank pull knee in at diagonal and then extend to active glute x 10 each     PATIENT EDUCATION:  Education details: did not have time to give handouts, start Medbridge next time Person educated: Patient Education method: Explanation, Demonstration, Verbal cues, and Handouts Education comprehension: verbalized understanding and returned demonstration   HOME EXERCISE PROGRAM: Access Code: [email or web address] URL: [email or web address] Date: 01/17/2023 Prepared by: Loistine Simas Luchiano Viscomi  Exercises - Modified Thomas Stretch  - 1 x daily - 7 x weekly - 2 sets - 2 reps - 30 hold - Supine Lower Trunk Rotation  - 1 x daily - 7 x weekly - 2 sets - 10 reps - Supine Bridge  - 1 x daily - 7 x weekly - 2 sets - 10 reps - Clamshell with Resistance  - 1 x daily - 7 x weekly - 2 sets - 10 reps - Supine Dead Bug with Leg Extension  - 1 x daily - 7 x weekly - 2 sets - 10 reps - Abdominal Bracing  - 1 x daily - 7 x weekly - 2 sets - 10 reps - Side Lunge Adductor Stretch  - 1 x daily - 7 x weekly - 2 sets - 2 reps - 30 hold - Standing Hip Flexor Stretch  - 1 x daily - 7 x weekly - 2 sets - 2 reps - 30 hold - Latissimus Dorsi Stretch at Wall  - 1 x daily - 7 x weekly - 2 sets - 2 reps - 30 hold - Pigeon Pose  - 2 x daily - 7 x weekly - 1 sets - 2 reps - 30 hold - Hooklying Isometric Hip Flexion  - 1 x daily - 7 x weekly - 1 sets - 5 reps - 5 hold - Runner's Climb  - 1 x daily - 7 x weekly - 1 sets - 10 reps - 3 hold - Figure 4 Bridge  - 1 x daily - 7 x weekly - 2 sets - 5 reps - Beginner Front Arm Support  - 1 x daily - 7 x weekly - 2 sets - 5 reps - 3 hold - Single-Leg [place] Deadlift With Kettlebell  - 1 x  daily - 7 x weekly - 1 sets - 10 reps - Single Leg Balance with Opposite Leg Star Reach  - 1 x daily - 7 x weekly - 2 sets - 5 reps - Backward Monster Walk with Resistance (BKA)  - 1 x daily - 7 x weekly - 2 sets - 10  reps - Forward Monster Walk with Resistance (BKA)  - 1 x daily - 7 x weekly - 2 sets - 10 reps - Single Leg Lunge with Foot on Bench  - 1 x daily - 7 x weekly - 1 sets - 10 reps - Goblet Squat with Kettlebell  - 1 x daily - 7 x weekly - 1 sets - 10 reps - Sidelying Hip Abduction  - 1 x daily - 7 x weekly - 1 sets - 10 reps - Quadruped on Forearms Bent Knee Hip Extension  - 1 x daily - 7 x weekly - 1 sets - 10 reps   ASSESSMENT:   CLINICAL IMPRESSION: Pt reports 75% improved toward goals of improved strength and endurance.  She was able to walk 7 miles with breaks on level ground (city walking) in [place] and participated in a 4 hour dance workshop.  PT added power loaded moves with lower reps to target ongoing subtle weakness in Rt LE surrounding hip ext and abd to build more Type II fibers.   She continues to notice asymmetry in strength of Rt LE within 2-3 miles of hiking. PT recommends taper to 1x/month x 3 more visits to have opportunity to check in for feedback and provide skilled input as Pt progresses power phase of HEP.    OBJECTIVE IMPAIRMENTS: decreased activity tolerance, decreased balance, difficulty walking, decreased strength, increased muscle spasms, impaired flexibility, improper body mechanics, postural dysfunction, and pain.    ACTIVITY LIMITATIONS: locomotion level   PARTICIPATION LIMITATIONS: community activity and hiking   PERSONAL FACTORS: Time since onset of injury/illness/exacerbation are also affecting patient's functional outcome.    REHAB POTENTIAL: Excellent   CLINICAL DECISION MAKING: Stable/uncomplicated   EVALUATION COMPLEXITY: Low     GOALS: Goals reviewed with patient? Yes   SHORT TERM GOALS: Target date: 10/26/22 Pt will be ind with initial HEP for Rt hip stretches (hip flexor, adductor, piriformis) and gluteal strength Baseline: Goal status: met   2.  Pt will achieve symmetrical pelvis and maintain between visits Baseline:  Goal status: met      LONG TERM GOALS: Target date: 04/11/23   Pt will be ind with advanced HEP so she can maintain hiking distances Baseline:  Goal status: ongoing   2.  Pt will be able to hike up to 8 miles without Rt LE fatigue. Baseline: fatigues at 2 miles Goal status: ongoing   3.  Pt will achieve 5/5 Rt gluteal strength for improved elevation hiking and stairs Baseline:  Goal status: ongoing   4.  Pt will verbalize understanding of psoas and gluteal role in pelvic mobility with gait to help her focus as fatigue sets in on longer hikes.  Baseline:  Goal status: met 4/24   5.  Pt will achieve at least 79% on FOTO to demo improved function Baseline: 69%, 79% on 2/22 Goal status: met   PLAN:   PT FREQUENCY: 1x/month   PT DURATION: 8 weeks   PLANNED INTERVENTIONS: Therapeutic exercises, Therapeutic activity, Neuromuscular re-education, Balance training, Gait training, Patient/Family education, Self Care, Joint mobilization, Joint manipulation, Dry Needling, Electrical stimulation, Spinal manipulation, Spinal mobilization, Cryotherapy, Moist heat, Taping, Traction,  Ionotophoresis /ml Dexamethasone, and Manual therapy   PLAN FOR NEXT SESSION:  taper to 1x/mo, f/u on heavier loading aspects of HEP for 3x/week targeting Type II muscle fibers for strength/power of weaker muscles (hip ext, hip abd)  Thoren Hosang, PT 01/17/23 1:32 PM

## 2023-02-01 ENCOUNTER — Encounter: Payer: BC Managed Care – PPO | Admitting: Physical Therapy

## 2023-02-02 DIAGNOSIS — H53143 Visual discomfort, bilateral: Secondary | ICD-10-CM | POA: Diagnosis not present

## 2023-02-15 ENCOUNTER — Encounter: Payer: BC Managed Care – PPO | Admitting: Physical Therapy

## 2023-03-07 ENCOUNTER — Encounter: Payer: Self-pay | Admitting: Physical Therapy

## 2023-03-07 ENCOUNTER — Ambulatory Visit: Payer: BC Managed Care – PPO | Attending: Family Medicine | Admitting: Physical Therapy

## 2023-03-07 DIAGNOSIS — R252 Cramp and spasm: Secondary | ICD-10-CM | POA: Insufficient documentation

## 2023-03-07 DIAGNOSIS — R293 Abnormal posture: Secondary | ICD-10-CM | POA: Insufficient documentation

## 2023-03-07 DIAGNOSIS — M25551 Pain in right hip: Secondary | ICD-10-CM | POA: Insufficient documentation

## 2023-03-07 NOTE — Therapy (Signed)
OUTPATIENT PHYSICAL THERAPY TREATMENT NOTE   Patient Name: Stephanie Walton MRN: [id number] DOB:[birth year], 52 y.o., female Today's Date: 03/07/2023   REFERRING PROVIDER: Lavada Mesi, MD   END OF SESSION:   PT End of Session - 03/07/23 1147     Visit Number 12    Date for PT Re-Evaluation 04/11/23    Authorization Type BCBS    PT Start Time 1147    PT Stop Time 1233    PT Time Calculation (min) 46 min    Activity Tolerance Patient tolerated treatment well    Behavior During Therapy [facility] for tasks assessed/performed                       History reviewed. No pertinent past medical history. History reviewed. No pertinent surgical history. 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  I hiked a lot and covered up to 8 miles a day.  I joined [employer]    PAIN:  Are you having pain? Yes NPRS scale: 0/10 Pain location: Rt proximal thigh anterior and lateral, a line from lateral hip to medial knee Pain orientation: Right  PAIN TYPE: aching and weak/fatigue Pain description: aching and fatigue   Aggravating factors: hiking and walking at mile 2, laying on Rt side Relieving factors: stretching  OBJECTIVE:    DIAGNOSTIC FINDINGS: n/a   PATIENT SURVEYS:  FOTO 11/16/22: 79% met goal FOTO 69% goal 79%    COGNITION: Overall cognitive status: Within functional limits for tasks assessed                         SENSATION: [facility]     MUSCLE LENGTH: 2/22:  THOMAS TEST: + on Rt for ITB tightness  Hamstrings WNL bil Rt adductors, hip flexors tight compared to Lt + Thomas test for ITB tightness bil   POSTURE:  2/22: symmetrical pelvis  pelvic asymmetry present: Rt inflare  PALPATION:  Rt psoas, adductors, glut min, glut med, piriformis tender to palpation Significant tone in Rt adductors Atrophy noted in Rt glut min, med, max compared to Lt   LOWER EXTREMITY ROM: WNL bil    LOWER EXTREMITY MMT:   MMT Right eval Left eval RIGHT 2/22 RIGHT 4/24 LEFT 2/22  Hip flexion 4+ 5 4+ 4+ 5  Hip extension 4 5 4  4+ 5  Hip abduction 4 4+ 4 4 4+  Hip adduction 4+ 5 4+ 4+ 5  Hip internal rotation     4+ 4+ 5  Hip external rotation 4 4+ 4 4 4+  Knee flexion 5 5 5 5 5   Knee extension 5 5 5 5 5   Ankle dorsiflexion         Ankle plantarflexion         Ankle inversion         Ankle eversion          (Blank rows = not tested)       FUNCTIONAL TESTS:  4/24:   2/22: Single leg squat: on Rt knee valgus occurs, more challenges with SLS balance than on Lt  Pt able to balance in SLS bil , functional squat WNL Pt gets audible snapping of hip flexor tendon on Rt with ASLR lowering phase   GAIT: Distance walked: within clinic Assistive device utilized: None Level of assistance: Complete Independence Comments: no signif gait deviations, suspect psoas inhibition on Rt     TODAY'S TREATMENT:                                                                                                                              DATE:  03/07/23: Recumbent bike L4 x 9' PT present to discuss status 15lb squats x5 and squat with march to SLS 5 each Single leg romanian deadlift 15lb kbell Sidestepping and monster walks fwd/bwd x 2 laps in circuit 10 step each way Runner's climb 2x10 8" riser Side lunge  alt with back lunge x 10 each  Single leg lunge with foot on bench hold 15lb kbell x 10 each leg  01/17/23: Recumbent bike L4 x 7' PT present to discuss status Hip flexor stretch foot on 2nd step reach up/across x 30" bil Dynamic hip flexor stretch with heel raise bil UE overhead x 10 heel raises  Sidebend hold 15lb at side x10 each  March x 5, then squat march x10 each LE, hold 10lb - focus more on power/speed for Type II fibers Bulgarian squat hold 10lb x 8 reps each bil - focus more on power/speed for Type II fibers Standing T at counter 10lb kbell x 10 - focus more on power/speed for Type II fibers SL Rt hip rainbow/mountain taps x 8, small circle and big circle x 5 each way  01/11/23: Recumbent bike L4 x 5' PT present to discuss status Hip flexor stretch foot on 2nd step reach up/across x 30" bil Dynamic hip  flexor stretch with heel raise bil UE overhead x 10 heel raises  Sidebend hold 15lb at side x10 each  March x 5, then squat march x10 each LE, hold 10lb  6" step up with knee driver 4lb ankle weight hold 5lb pull to knee x 10 reps  Hip ext 4lb ankle weight 2x10 bil Plank hands on mat table 4lb ankle weights slow motion mountain climber knee driver alt LE x5 Chair plank feet on sliders mountain climbers x 20 SL hip abd 4lb ankle weights x10 each LE Quadruped on elbows hip extension rainbow 4lb ankle weight x8 reps Pigeon stretch x60" bil Standing SB with overhead reach stretch x 30" bil   HEP:  In lunge position with both arms overhead, add dynamic heel raise up/down to hip flexor stretch x 15 heel raises Seated slumpy psoas - 5x 5" holds with breathing - you can add load to this with balanced dumbbell on thigh and do slow motion small range marches 2x10 Long sitting slumpy posture straight leg raises to fatigue  Wall plank pull knee in at diagonal and then extend to active glute x 10 each     PATIENT EDUCATION:  Education details: did not have time to give handouts, start  Medbridge next time Person educated: Patient Education method: Explanation, Demonstration, Verbal cues, and Handouts Education comprehension: verbalized understanding and returned demonstration   HOME EXERCISE PROGRAM: Access Code: [email or web address] URL: [email or web address] Date: 03/07/2023 Prepared by: Loistine Simas Lauren Aguayo  Exercises - Goblet Squat with Kettlebell  - 1 x daily - 7 x weekly - 2 sets - 5 reps - Single-Leg [place] Deadlift With Kettlebell  - 1 x daily - 7 x weekly - 1 sets - 10 reps - Runner's Climb  - 1 x daily - 7 x weekly - 1 sets - 10 reps - 3 hold - Side Stepping with Resistance at Thighs  - 1 x daily - 7 x weekly - 3 sets - 10 reps - Backward Monster Walk with Resistance (BKA)  - 1 x daily - 7 x weekly - 3 sets - 10 reps - Forward Monster Walk with Resistance (BKA)  - 1 x daily - 7 x weekly - 3 sets - 10 reps - Single Leg Lunge with Foot on Bench  - 1 x daily - 7 x weekly - 1 sets - 10 reps - Lateral Lunge  - 1 x daily - 7 x weekly - 1 sets - 10 reps - Modified Thomas Stretch  - 1 x daily - 7 x weekly - 2 sets - 2 reps - 30 hold - Supine Lower Trunk Rotation  - 1 x daily - 7 x weekly - 2 sets - 10 reps - Supine Bridge  - 1 x daily - 7 x weekly - 2 sets - 10 reps - Clamshell with Resistance  - 1 x daily - 7 x weekly - 2 sets - 10 reps - Supine Dead Bug with Leg Extension  - 1 x daily - 7 x weekly - 2 sets - 10 reps - Abdominal Bracing  - 1 x daily - 7 x weekly - 2 sets - 10 reps - Side Lunge Adductor Stretch  - 1 x daily - 7 x weekly - 2 sets - 2 reps - 30 hold - Standing Hip Flexor Stretch  - 1 x daily - 7 x weekly - 2 sets - 2 reps - 30 hold - Latissimus Dorsi Stretch at Wall  - 1 x daily - 7  x weekly - 2 sets - 2 reps - 30 hold - Pigeon Pose  - 2 x daily - 7 x weekly - 1 sets - 2 reps - 30 hold - Hooklying Isometric Hip Flexion  - 1 x daily - 7 x weekly - 1 sets - 5 reps - 5 hold - Figure 4 Bridge  - 1 x daily - 7 x weekly - 2 sets - 5 reps - Beginner Front  Arm Support  - 1 x daily - 7 x weekly - 2 sets - 5 reps - 3 hold - Single Leg Balance with Opposite Leg Star Reach  - 1 x daily - 7 x weekly - 2 sets - 5 reps - Sidelying Hip Abduction  - 1 x daily - 7 x weekly - 1 sets - 10 reps - Quadruped on Forearms Bent Knee Hip Extension  - 1 x daily - 7 x weekly - 1 sets - 10 reps   ASSESSMENT:   CLINICAL IMPRESSION: Pt traveled and hiked and felt she has been doing very well.  She asked for a shorter lower body circuit to prioritize as she has joined a gym.  She will trial above circuit and we discussed loading to target type 2 fibers for improved strength.  We also discussed varying incline levels on treadmill as a circuit to simulate hikes.  OBJECTIVE IMPAIRMENTS: decreased activity tolerance, decreased balance, difficulty walking, decreased strength, increased muscle spasms, impaired flexibility, improper body mechanics, postural dysfunction, and pain.    ACTIVITY LIMITATIONS: locomotion level   PARTICIPATION LIMITATIONS: community activity and hiking   PERSONAL FACTORS: Time since onset of injury/illness/exacerbation are also affecting patient's functional outcome.    REHAB POTENTIAL: Excellent   CLINICAL DECISION MAKING: Stable/uncomplicated   EVALUATION COMPLEXITY: Low     GOALS: Goals reviewed with patient? Yes   SHORT TERM GOALS: Target date: 10/26/22 Pt will be ind with initial HEP for Rt hip stretches (hip flexor, adductor, piriformis) and gluteal strength Baseline: Goal status: met   2.  Pt will achieve symmetrical pelvis and maintain between visits Baseline:  Goal status: met     LONG TERM GOALS: Target date: 04/11/23   Pt will be ind with advanced HEP so she can maintain hiking distances Baseline:  Goal status: ongoing   2.  Pt will be able to hike up to 8 miles without Rt LE fatigue. Baseline: fatigues at 2 miles Goal status: ongoing   3.  Pt will achieve 5/5 Rt gluteal strength for improved elevation hiking and  stairs Baseline:  Goal status: ongoing   4.  Pt will verbalize understanding of psoas and gluteal role in pelvic mobility with gait to help her focus as fatigue sets in on longer hikes.  Baseline:  Goal status: met 4/24   5.  Pt will achieve at least 79% on FOTO to demo improved function Baseline: 69%, 79% on 2/22 Goal status: met   PLAN:   PT FREQUENCY: 1x/month   PT DURATION: 8 weeks   PLANNED INTERVENTIONS: Therapeutic exercises, Therapeutic activity, Neuromuscular re-education, Balance training, Gait training, Patient/Family education, Self Care, Joint mobilization, Joint manipulation, Dry Needling, Electrical stimulation, Spinal manipulation, Spinal mobilization, Cryotherapy, Moist heat, Taping, Traction, Ionotophoresis 4mg /ml Dexamethasone, and Manual therapy   PLAN FOR NEXT SESSION:  f/u on lower body circuit, type II muscle fiber targets for strength training/gains, did Pt trial incline variations on TM  Irlene Crudup, PT 03/07/23 12:35 PM

## 2023-03-26 ENCOUNTER — Encounter: Payer: Self-pay | Admitting: Physical Therapy

## 2023-03-26 ENCOUNTER — Ambulatory Visit: Payer: BC Managed Care – PPO | Attending: Family Medicine | Admitting: Physical Therapy

## 2023-03-26 DIAGNOSIS — M25551 Pain in right hip: Secondary | ICD-10-CM | POA: Insufficient documentation

## 2023-03-26 DIAGNOSIS — R252 Cramp and spasm: Secondary | ICD-10-CM | POA: Diagnosis not present

## 2023-03-26 DIAGNOSIS — R293 Abnormal posture: Secondary | ICD-10-CM | POA: Insufficient documentation

## 2023-03-26 NOTE — Therapy (Signed)
OUTPATIENT PHYSICAL THERAPY TREATMENT NOTE   Patient Name: Stephanie Walton MRN: [id number] DOB:25-Jun-1971, 52 y.o., female Today's Date: 03/26/2023   REFERRING PROVIDER: Lavada Mesi, MD   END OF SESSION:   PT End of Session - 03/26/23 1450     Visit Number 13    Date for PT Re-Evaluation 04/11/23    Authorization Type BCBS    PT Start Time 1449    PT Stop Time 1528    PT Time Calculation (min) 39 min    Activity Tolerance Patient tolerated treatment well    Behavior During Therapy [facility] for tasks assessed/performed                        History reviewed. No pertinent past medical history. History reviewed. No pertinent surgical history. 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  I am varying my incline on TM for walking.  I am doing the line of machines   PAIN:  Are you having pain? Yes NPRS scale: 0/10 Pain location: Rt proximal thigh anterior and lateral, a line from lateral hip to medial knee Pain orientation: Right  PAIN TYPE: aching and weak/fatigue Pain description: aching and fatigue   Aggravating factors: hiking and walking at mile 2, laying on Rt side Relieving factors: stretching  OBJECTIVE:    DIAGNOSTIC FINDINGS: n/a   PATIENT SURVEYS:  FOTO 11/16/22: 79% met goal FOTO 69% goal 79%    COGNITION: Overall cognitive status: Within functional limits for tasks assessed                         SENSATION: [facility]     MUSCLE LENGTH: 2/22:  THOMAS TEST: + on Rt for ITB tightness  Hamstrings WNL bil Rt adductors, hip flexors tight compared to Lt + Thomas test for ITB tightness bil   POSTURE:  2/22: symmetrical pelvis  pelvic asymmetry present: Rt inflare  PALPATION:  Rt psoas, adductors, glut min, glut med, piriformis tender to palpation Significant tone in Rt adductors Atrophy noted in Rt glut min, med, max compared to Lt   LOWER EXTREMITY ROM: WNL bil    LOWER EXTREMITY MMT:   MMT Right eval Left eval RIGHT 2/22 RIGHT 4/24 LEFT 2/22  Hip flexion 4+ 5 4+ 4+ 5  Hip extension 4 5 4  4+ 5  Hip abduction 4 4+ 4 4 4+  Hip adduction 4+ 5 4+ 4+ 5  Hip internal rotation     4+ 4+ 5  Hip external rotation 4 4+ 4 4 4+  Knee flexion 5 5 5 5 5   Knee extension 5 5 5 5 5   Ankle dorsiflexion         Ankle plantarflexion         Ankle inversion         Ankle eversion          (Blank rows = not tested)       FUNCTIONAL TESTS:  7/1: able to control Rt knee valgus with squats, lunges, step ups   2/22: Single leg squat: on Rt knee valgus occurs, more challenges with SLS balance than on Lt  Pt able to balance in SLS bil , functional squat WNL Pt gets audible snapping of hip flexor tendon on Rt with ASLR lowering phase   GAIT: Distance walked: within clinic Assistive device utilized: None Level of assistance: Complete Independence Comments: no signif gait deviations, suspect psoas inhibition on Rt     TODAY'S TREATMENT:                                                                                                                              DATE:  03/26/23: Recumbent bike L4 x 6' PT present to discuss status 15lb squats x5 and squat with march to SLS 5 each Single leg romanian deadlift 15lb kbell Sidestepping and monster walks fwd/bwd x 2 laps in circuit  10 step each way Runner's climb 2x10 8" riser Side lunge alt with back lunge x 10 each  Single leg lunge with foot on bench hold 15lb kbell x 10 each leg  03/07/23: Recumbent bike L4 x 9' PT present to discuss status 15lb squats x5 and squat with march to SLS 5 each Single leg romanian deadlift 15lb kbell Sidestepping and monster walks fwd/bwd x 2 laps in circuit 10 step each way Runner's climb 2x10 8" riser Side lunge alt with back lunge x 10 each  Single leg lunge with foot on bench hold 15lb kbell x 10 each leg  01/17/23: Recumbent bike L4 x 7' PT present to discuss status Hip flexor stretch foot on 2nd step reach up/across x 30" bil Dynamic hip flexor stretch with heel raise bil UE overhead x 10 heel raises  Sidebend hold 15lb at side x10 each  March x 5, then squat march x10 each LE, hold 10lb - focus more  on power/speed for Type II fibers Bulgarian squat hold 10lb x 8 reps each bil - focus more on power/speed for Type II fibers Standing T at counter 10lb kbell x 10 - focus more on power/speed for Type II fibers SL Rt hip rainbow/mountain taps x 8, small circle and big circle x 5 each way    HEP:  In lunge position with both arms overhead, add dynamic heel raise up/down to hip flexor stretch x 15 heel raises Seated slumpy psoas - 5x 5" holds with breathing - you can add load to this with balanced dumbbell on thigh and do slow motion small range marches 2x10 Long sitting slumpy posture straight leg raises to fatigue  Wall plank pull knee in at diagonal and then extend to active glute x 10 each     PATIENT EDUCATION:  Education details: did not have time to give handouts, start Medbridge next time Person educated: Patient Education method: Explanation, Demonstration, Verbal cues, and Handouts Education comprehension: verbalized understanding and returned demonstration   HOME EXERCISE PROGRAM: Access Code: [email or web address] URL: [email or web address] Date:  03/07/2023 Prepared by: Loistine Simas Brylan Dec  Exercises - Goblet Squat with Kettlebell  - 1 x daily - 7 x weekly - 2 sets - 5 reps - Single-Leg [place] Deadlift With Kettlebell  - 1 x daily - 7 x weekly - 1 sets - 10 reps - Runner's Climb  - 1 x daily - 7 x weekly - 1 sets - 10 reps - 3 hold - Side Stepping with Resistance at Thighs  - 1 x daily - 7 x weekly - 3 sets - 10 reps - Backward Monster Walk with Resistance (BKA)  - 1 x daily - 7 x weekly - 3 sets - 10 reps - Forward Monster Walk with Resistance (BKA)  - 1 x daily - 7 x weekly - 3 sets - 10 reps - Single Leg Lunge with Foot on Bench  - 1 x daily - 7 x weekly - 1 sets - 10 reps - Lateral Lunge  - 1 x daily - 7 x weekly - 1 sets - 10 reps - Modified Thomas Stretch  - 1 x daily - 7 x weekly - 2 sets - 2 reps - 30 hold - Supine Lower Trunk Rotation  - 1 x daily - 7 x weekly - 2 sets - 10 reps - Supine Bridge  - 1 x daily - 7 x weekly - 2 sets - 10 reps - Clamshell with Resistance  - 1 x daily - 7 x weekly - 2 sets - 10 reps - Supine Dead Bug with Leg Extension  - 1 x daily - 7 x weekly - 2 sets - 10 reps - Abdominal Bracing  - 1 x daily - 7 x weekly - 2 sets - 10 reps - Side Lunge Adductor Stretch  - 1 x daily - 7 x weekly - 2 sets - 2 reps - 30 hold - Standing Hip Flexor Stretch  - 1 x daily - 7 x weekly - 2 sets - 2 reps - 30 hold - Latissimus Dorsi Stretch at Wall  - 1 x daily - 7 x weekly - 2 sets - 2 reps - 30 hold - Pigeon Pose  - 2 x daily - 7 x weekly - 1 sets - 2 reps - 30 hold - Hooklying Isometric Hip Flexion  - 1 x daily - 7 x weekly - 1 sets - 5 reps - 5 hold - Figure 4 Bridge  -  1 x daily - 7 x weekly - 2 sets - 5 reps - Beginner Front Arm Support  - 1 x daily - 7 x weekly - 2 sets - 5 reps - 3 hold - Single Leg Balance with Opposite Leg Star Reach  - 1 x daily - 7 x weekly - 2 sets - 5 reps - Sidelying Hip Abduction  - 1 x daily - 7 x weekly - 1 sets - 10 reps - Quadruped on Forearms Bent Knee Hip Extension  - 1 x daily -  7 x weekly - 1 sets - 10 reps   ASSESSMENT:   CLINICAL IMPRESSION: Pt has high level HEP for LE loading for type II fiber target goal for strength.  She hasn't tried long challenging hikes lately but demos improved closed chain balance and strength.  She does have a deficit of strength on Rt compared to Lt LE but will continue to make strength gains with adherence to HEP.    OBJECTIVE IMPAIRMENTS: decreased activity tolerance, decreased balance, difficulty walking, decreased strength, increased muscle spasms, impaired flexibility, improper body mechanics, postural dysfunction, and pain.    ACTIVITY LIMITATIONS: locomotion level   PARTICIPATION LIMITATIONS: community activity and hiking   PERSONAL FACTORS: Time since onset of injury/illness/exacerbation are also affecting patient's functional outcome.    REHAB POTENTIAL: Excellent   CLINICAL DECISION MAKING: Stable/uncomplicated   EVALUATION COMPLEXITY: Low     GOALS: Goals reviewed with patient? Yes   SHORT TERM GOALS: Target date: 10/26/22 Pt will be ind with initial HEP for Rt hip stretches (hip flexor, adductor, piriformis) and gluteal strength Baseline: Goal status: met   2.  Pt will achieve symmetrical pelvis and maintain between visits Baseline:  Goal status: met     LONG TERM GOALS: Target date: 04/11/23   Pt will be ind with advanced HEP so she can maintain hiking distances Baseline:  Goal status: met 7/1   2.  Pt will be able to hike up to 8 miles without Rt LE fatigue. Baseline: fatigues at 2 miles Goal status: hasn't hiked but has done 4 miles 7/1, partially met   3.  Pt will achieve 5/5 Rt gluteal strength for improved elevation hiking and stairs Baseline:  Goal status: met 7/1   4.  Pt will verbalize understanding of psoas and gluteal role in pelvic mobility with gait to help her focus as fatigue sets in on longer hikes.  Baseline:  Goal status: met 4/24   5.  Pt will achieve at least 79% on FOTO to demo  improved function Baseline: 69%, 79% on 2/22 Goal status: met   PLAN:   PT FREQUENCY: 1x/month   PT DURATION: 8 weeks   PLANNED INTERVENTIONS: Therapeutic exercises, Therapeutic activity, Neuromuscular re-education, Balance training, Gait training, Patient/Family education, Self Care, Joint mobilization, Joint manipulation, Dry Needling, Electrical stimulation, Spinal manipulation, Spinal mobilization, Cryotherapy, Moist heat, Taping, Traction, Ionotophoresis 4mg /ml Dexamethasone, and Manual therapy   PLAN FOR NEXT SESSION:  Pt ready to d/c to HEP - has a good consistent routine she has incorporated 3-4 days/week  PHYSICAL THERAPY DISCHARGE SUMMARY  Visits from Start of Care: 13  Current functional level related to goals / functional outcomes: met   Remaining deficits: See above   Education / Equipment: HEP   Patient agrees to discharge. Patient goals were met. Patient is being discharged due to meeting the stated rehab goals.  Adalei Novell, PT 03/26/23 3:29 PM

## 2023-04-09 ENCOUNTER — Ambulatory Visit: Payer: BC Managed Care – PPO | Admitting: Physical Therapy

## 2023-06-28 ENCOUNTER — Other Ambulatory Visit: Payer: Self-pay | Admitting: Family Medicine

## 2023-06-28 DIAGNOSIS — G8929 Other chronic pain: Secondary | ICD-10-CM

## 2023-07-18 DIAGNOSIS — D1801 Hemangioma of skin and subcutaneous tissue: Secondary | ICD-10-CM | POA: Diagnosis not present

## 2023-07-18 DIAGNOSIS — D239 Other benign neoplasm of skin, unspecified: Secondary | ICD-10-CM | POA: Diagnosis not present

## 2023-08-29 ENCOUNTER — Ambulatory Visit
Admission: RE | Admit: 2023-08-29 | Discharge: 2023-08-29 | Disposition: A | Discharge status: Home / Self Care | Payer: BC Managed Care – PPO | Source: Ambulatory Visit | Attending: Family Medicine | Admitting: Family Medicine

## 2023-08-29 DIAGNOSIS — S73191A Other sprain of right hip, initial encounter: Secondary | ICD-10-CM | POA: Diagnosis not present

## 2023-08-29 DIAGNOSIS — G8929 Other chronic pain: Secondary | ICD-10-CM

## 2023-10-02 ENCOUNTER — Encounter: Payer: Self-pay | Admitting: Physical Therapy

## 2023-10-02 ENCOUNTER — Other Ambulatory Visit: Payer: Self-pay

## 2023-10-02 ENCOUNTER — Ambulatory Visit: Payer: BC Managed Care – PPO | Attending: Family Medicine | Admitting: Physical Therapy

## 2023-10-02 DIAGNOSIS — M6281 Muscle weakness (generalized): Secondary | ICD-10-CM | POA: Diagnosis not present

## 2023-10-02 DIAGNOSIS — M25651 Stiffness of right hip, not elsewhere classified: Secondary | ICD-10-CM | POA: Diagnosis not present

## 2023-10-02 DIAGNOSIS — M25551 Pain in right hip: Secondary | ICD-10-CM | POA: Insufficient documentation

## 2023-10-02 NOTE — Therapy (Signed)
 OUTPATIENT PHYSICAL THERAPY LOWER EXTREMITY EVALUATION   Patient Name: Stephanie Walton MRN: [id number] DOB:1971/06/14, 53 y.o., female Today's Date: 10/02/2023  END OF SESSION:  PT End of Session - 10/02/23 1100     Visit Number 1    Date for PT Re-Evaluation 11/27/23    Authorization Type BCBS    PT Start Time 1100    PT Stop Time 1144    PT Time Calculation (min) 44 min    Activity Tolerance Patient tolerated treatment well             History reviewed. No pertinent past medical history. History reviewed. No pertinent surgical history. 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  Symptoms present for years.   2 miles walking is the limit at one time.  Have to be careful with swing dancing.  Have appt with surgeon [name] on the 23rd.   I joined a Engineer, Site 30 min TM walking, leg press (feels good),hip abductor/adductor 55#, lats, row; PVC trunk rotation; leg swings;  I haven't done my other home ex's in a while but going to start back Standing abdominal leg lifts will produce popping right hip   PERTINENT HISTORY: History of Right ankle peroneal tendonitis History of disc bulge PAIN:   Are you having pain? no NPRS scale: fairly normal right now 0/10 Pain location: right buttock lateral; feels not symmetrical, right leg wants to turn in Pain orientation: Right  Pain description: intermittent  Aggravating factors: walking 2 miles, popping with certain movements Relieving factors: some of the  exercises  PRECAUTIONS: None    WEIGHT BEARING RESTRICTIONS: No  FALLS:  Has patient fallen in last 6 months? No LIVING ENVIRONMENT: Lives with: lives with their spouse Lives in: House/apartment Stairs: Yes: Internal: 16 steps; on right going up and External: 3 steps; on right going up Has following equipment at home: None  OCCUPATION: full time, international travel, standing desk and lab work   PLOF: Independent  PATIENT GOALS: determine if surgery is needed or if PT can manage this problem  NEXT MD VISIT: consult with surgeon [name] later this month  OBJECTIVE:  Note: Objective measures were completed at Evaluation unless otherwise noted.  DIAGNOSTIC FINDINGS: 08/29/23: MR OF THE RIGHT HIP WITHOUT CONTRAST   TECHNIQUE: Multiplanar, multisequence MR imaging was performed. No intravenous contrast was administered.   COMPARISON:  None Available.   FINDINGS: Bones: Unremarkable   Articular cartilage and labrum   Articular cartilage:  Unremarkable   Labrum:  Torn anterior superior labrum, image 17 series 13.   Joint or bursal effusion   Joint effusion:  Absent   Bursae: No regional bursitis.   Muscles and tendons   Muscles and tendons: Mild band of edema anterolaterally in the gluteus medius muscle, images 5 through 12 series 11, potentially from muscle strain.   Other findings   Miscellaneous:   No sciatic notch impingement.   IMPRESSION: 1. Torn right acetabular anterior superior labrum. 2. Mild band of edema anterolaterally in the gluteus medius muscle, potentially from muscle  strain.      PATIENT SURVEYS:  FOTO 67%  COGNITION: Overall cognitive status: Within functional limits for tasks assessed     PALPATION: Rt  glut min, glut med, piriformis tender to palpation  LOWER EXTREMITY ROM:  full and symmetrical hip ROM although pt is fearful with passive hip internal and external rotation; no pain with full knee to chest hip flexion;   decreased right hip flexor length noted in sidelying  LOWER EXTREMITY MMT:  right hip abduction 4/5;  right hip flexion and extension 4+/5  TRUNK STRENGTH:  Decreased activation of transverse abdominus muscles; abdominals 4/5; decreased activation of lumbar multifidi; trunk extensors 4/5 Decreased stability noted with bird dogs (WB on right knee)   LOWER EXTREMITY SPECIAL TESTS:  Immediate Pelvic drop with SLS on right   FUNCTIONAL TESTS:  Able to do a full squat however shifts toward the left on descent and rise  GAIT: Comments: WNLs for clinic ambulation                                                                                                                                TREATMENT DATE: 10/02/23    PATIENT EDUCATION:  Education details: Educated patient on anatomy and physiology of current symptoms, prognosis, plan of care as well as initial self care strategies to promote recovery Person educated: Patient Education method: Explanation Education comprehension: verbalized understanding  HOME EXERCISE PROGRAM: To be started: SLS without pelvic drop, bird dogs, side planks on knees with clams or drivers  ASSESSMENT:  CLINICAL IMPRESSION: Patient is a 53 y.o. female who was seen today for physical therapy evaluation and treatment for right hip labral tear.  No current pain but she does report she is unable to walk more than 2 miles and has some intermittent hip popping with hip flexion.  Overall hip mobility is good but with decreased hip flexor length noted.  Glute medius weakness evident with immediate pelvic drop on right with single leg standing, able to correct with cues.  Decreased stability also noted with bird dogs/weight bearing on right.  Tender points in gluteals upon palpation.  She would benefit from PT to address these deficits.    OBJECTIVE IMPAIRMENTS: decreased activity tolerance, decreased strength, increased fascial restrictions, impaired perceived  functional ability, and pain.   ACTIVITY LIMITATIONS: squatting and locomotion level  PARTICIPATION LIMITATIONS: community activity and recreation  PERSONAL FACTORS: Time since onset of injury/illness/exacerbation are also affecting patient's functional outcome.   REHAB POTENTIAL: Good  CLINICAL DECISION MAKING: Stable/uncomplicated  EVALUATION COMPLEXITY: Low   GOALS: Goals reviewed with patient? Yes  SHORT TERM GOALS: Target date: 11/13/2023    The patient will demonstrate knowledge of basic self care strategies and exercises to promote healing  Baseline: Goal status: INITIAL  2.  The patient will have improved hip strength to at least 4/5 needed for standing, walking longer distances  Baseline:  Goal status: INITIAL  3.  Able  to single leg stand on right 10 sec without pelvic drop Baseline:  Goal status: INITIAL       LONG TERM GOALS: Target date:12/25/2023      The patient will be independent in a safe self progression of a home exercise program to promote further recovery of function  Baseline:  Goal status: INITIAL  2.  The patient will have improved hip strength to at least 4+/5 needed for standing, walking longer distances Baseline:  Goal status: INITIAL  3.  Able to do a full squat without compensatory shifting Baseline:  Goal status: INITIAL  4.  Able to walk 3 miles for exercise/recreation Baseline:  Goal status: INITIAL  5.  The patient will have improved FOTO score to   76%    indicating improved function with less pain  Baseline:  Goal status: INITIAL    PLAN:  PT FREQUENCY: every other week  PT DURATION: 12 weeks  PLANNED INTERVENTIONS: 97164- PT Re-evaluation, 97110-Therapeutic exercises, 97530- Therapeutic activity, 97112- Neuromuscular re-education, 97535- Self Care, 02859- Manual therapy, [id number]- Aquatic Therapy, 97014- Electrical stimulation (unattended), [id number]- Electrical stimulation (manual), [id number]- Ultrasound, 02966-  Ionotophoresis 4mg /ml Dexamethasone, Taping, Dry Needling, Joint mobilization, Moist heat, and Biofeedback  PLAN FOR NEXT SESSION: SLS without pelvic drop and single leg progression, bird dogs, side planks on knees with clams or drivers; needle phobic  Glade Pesa, PT 10/02/23 4:50 PM Phone: [phone] Fax: [phone]

## 2023-10-17 ENCOUNTER — Ambulatory Visit: Payer: BC Managed Care – PPO | Admitting: Physical Therapy

## 2023-10-17 DIAGNOSIS — M6281 Muscle weakness (generalized): Secondary | ICD-10-CM

## 2023-10-17 DIAGNOSIS — M25551 Pain in right hip: Secondary | ICD-10-CM | POA: Diagnosis not present

## 2023-10-17 DIAGNOSIS — M25651 Stiffness of right hip, not elsewhere classified: Secondary | ICD-10-CM

## 2023-10-17 NOTE — Therapy (Signed)
OUTPATIENT PHYSICAL THERAPY LOWER EXTREMITY PROGRESS NOTE   Patient Name: Stephanie Walton MRN: [id number] DOB:1971/04/13, 53 y.o., female Today's Date: 10/17/2023  END OF SESSION:  PT End of Session - 10/17/23 1145     Visit Number 2    Date for PT Re-Evaluation 11/27/23    Authorization Type BCBS    PT Start Time 1145    PT Stop Time 1230    PT Time Calculation (min) 45 min    Activity Tolerance Patient tolerated treatment well             No past medical history on file. No past surgical history on file. 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  Nothing serious (pain-wise).  I've been standing on one leg.      EVAL:New imaging shows Anterior superior right labral hip tear.  Symptoms present for years.   2 miles walking is the limit at one time.  Have to be careful with swing dancing.  Have appt with surgeon Dr. Jason Nest on the 23rd.   I joined a Engineer, site 30 min TM walking, leg press (feels good),hip abductor/adductor 55#, lats, row; PVC trunk rotation; leg swings;  I haven't done my other home ex's in a while but going to start back Standing abdominal leg lifts will produce popping right hip   PERTINENT HISTORY: History of Right ankle peroneal tendonitis History of disc bulge PAIN:   Are you having pain? no NPRS scale:  1/10 Pain location: right buttock lateral; feels not symmetrical, right leg wants to turn in Pain orientation: Right  Pain description: intermittent  Aggravating factors: walking 2 miles, popping with certain movements Relieving  factors: some of the exercises  PRECAUTIONS: None    WEIGHT BEARING RESTRICTIONS: No  FALLS:  Has patient fallen in last 6 months? No LIVING ENVIRONMENT: Lives with: lives with their spouse Lives in: House/apartment Stairs: Yes: Internal: 16 steps; on right going up and External: 3 steps; on right going up Has following equipment at home: None  OCCUPATION: full time, international travel, standing desk and lab work   PLOF: Independent  PATIENT GOALS: determine if surgery is needed or if PT can manage this problem  NEXT MD VISIT: consult with surgeon Dr. Jason Nest later this month  OBJECTIVE:  Note: Objective measures were completed at Evaluation unless otherwise noted.  DIAGNOSTIC FINDINGS: 08/29/23: MR OF THE RIGHT HIP WITHOUT CONTRAST   TECHNIQUE: Multiplanar, multisequence MR imaging was performed. No intravenous contrast was administered.   COMPARISON:  None Available.   FINDINGS: Bones: Unremarkable   Articular cartilage and labrum   Articular cartilage:  Unremarkable   Labrum:  Torn anterior superior labrum, image 17 series 13.   Joint or bursal effusion   Joint effusion:  Absent   Bursae: No regional bursitis.   Muscles and tendons   Muscles and tendons: Mild band of edema anterolaterally in the gluteus medius muscle, images 5 through 12 series 11, potentially from muscle strain.   Other findings   Miscellaneous:   No sciatic notch impingement.   IMPRESSION: 1. Torn right acetabular  anterior superior labrum. 2. Mild band of edema anterolaterally in the gluteus medius muscle, potentially from muscle strain.      PATIENT SURVEYS:  FOTO 67%  COGNITION: Overall cognitive status: Within functional limits for tasks assessed     PALPATION: Rt  glut min, glut med, piriformis tender to palpation  LOWER EXTREMITY ROM:  full and symmetrical hip ROM although pt is fearful with passive hip internal and external rotation; no pain with full knee to chest  hip flexion;  decreased right hip flexor length noted in sidelying  LOWER EXTREMITY MMT:  right hip abduction 4/5;  right hip flexion and extension 4+/5  TRUNK STRENGTH:  Decreased activation of transverse abdominus muscles; abdominals 4/5; decreased activation of lumbar multifidi; trunk extensors 4/5 Decreased stability noted with bird dogs (WB on right knee)   LOWER EXTREMITY SPECIAL TESTS:  Immediate Pelvic drop with SLS on right   FUNCTIONAL TESTS:  Able to do a full squat however shifts toward the left on descent and rise  GAIT: Comments: WNLs for clinic ambulation                                                                                                                                TREATMENT DATE: 10/17/23  Bike 6 min L4 while during status update SLS in front of the mirror progression to "stand tall" activating gluteals 10x right/left Bird dogs alternating sides 10x (added to HEP) Side planks 5 sec hold 5x right/left (added to HEP) Side planks with clam 5x right/left (added to HEP) Dead lifts 10# kettlebell 10x, increased to 15# Kettlebell 10x Goblet Squats with 10# 10x Discussed use of leg press at the gym including single leg Staggered stance dead lift with 10# kettlebell (very challenging on right and needs some UE support and limited ROM to knee level) (added to HEP) Doubled green band above thighs with hip hinge hip clocks 10x right/left (added to HEP)   PATIENT EDUCATION:  Education details: Educated patient on anatomy and physiology of current symptoms, prognosis, plan of care as well as initial self care strategies to promote recovery Person educated: Patient Education method: Explanation Education comprehension: verbalized understanding  HOME EXERCISE PROGRAM: Access Code: [email or web address] URL: [email or web address] Date: 10/17/2023 Prepared by: Lavinia Sharps Most recent ex's given at the top of the list: used Medbridge access code from prior  course of PT Exercises - HIP CLOCKS  - 1 x daily - 7 x weekly - 1 sets - 10 reps - Single Leg Deadlift with Kettlebell  - 1 x daily - 7 x weekly - 1 sets - 10 reps - Side Plank with Clam  - 1 x daily - 7 x weekly - 1 sets - 5 reps - Side Plank on Knees  - 1 x daily - 7 x weekly - 1 sets - 5 reps - Bird Dog  - 1 x daily - 7 x weekly - 1 sets - 10 reps -  Goblet Squat with Kettlebell  - 1 x daily - 7 x weekly - 2 sets - 5 reps - Single-Leg [place] Deadlift With Kettlebell  - 1 x daily - 7 x weekly - 1 sets - 10 reps - Runner's Climb  - 1 x daily - 7 x weekly - 1 sets - 10 reps - 3 hold - Side Stepping with Resistance at Thighs  - 1 x daily - 7 x weekly - 3 sets - 10 reps - Backward Monster Walk with Resistance (BKA)  - 1 x daily - 7 x weekly - 3 sets - 10 reps - Forward Monster Walk with Resistance (BKA)  - 1 x daily - 7 x weekly - 3 sets - 10 reps - Single Leg Lunge with Foot on Bench  - 1 x daily - 7 x weekly - 1 sets - 10 reps - Lateral Lunge  - 1 x daily - 7 x weekly - 1 sets - 10 reps - Modified Thomas Stretch  - 1 x daily - 7 x weekly - 2 sets - 2 reps - 30 hold - Supine Lower Trunk Rotation  - 1 x daily - 7 x weekly - 2 sets - 10 reps - Supine Bridge  - 1 x daily - 7 x weekly - 2 sets - 10 reps - Clamshell with Resistance  - 1 x daily - 7 x weekly - 2 sets - 10 reps - Supine Dead Bug with Leg Extension  - 1 x daily - 7 x weekly - 2 sets - 10 reps - Abdominal Bracing  - 1 x daily - 7 x weekly - 2 sets - 10 reps - Side Lunge Adductor Stretch  - 1 x daily - 7 x weekly - 2 sets - 2 reps - 30 hold - Standing Hip Flexor Stretch  - 1 x daily - 7 x weekly - 2 sets - 2 reps - 30 hold - Latissimus Dorsi Stretch at Wall  - 1 x daily - 7 x weekly - 2 sets - 2 reps - 30 hold - Pigeon Pose  - 2 x daily - 7 x weekly - 1 sets - 2 reps - 30 hold - Hooklying Isometric Hip Flexion  - 1 x daily - 7 x weekly - 1 sets - 5 reps - 5 hold - Figure 4 Bridge  - 1 x daily - 7 x weekly - 2 sets - 5 reps -  Beginner Front Arm Support  - 1 x daily - 7 x weekly - 2 sets - 5 reps - 3 hold - Single Leg Balance with Opposite Leg Star Reach  - 1 x daily - 7 x weekly - 2 sets - 5 reps - Sidelying Hip Abduction  - 1 x daily - 7 x weekly - 1 sets - 10 reps - Quadruped on Forearms Bent Knee Hip Extension  - 1 x daily - 7 x weekly - 1 sets - 10 reps   ASSESSMENT:  CLINICAL IMPRESSION: Exercises performed to target gluteal, core and quad muscle groups.  She is most challenged with single leg movements with full weight on the right LE.  Staggered stance dead lifts were difficult with right weight bearing and modifications are needed (limited ROM and UE touch support).  Verbal cues along with mirror feedback for patellofemoral alignment and forward gaze with hip hinge and squat.       OBJECTIVE IMPAIRMENTS: decreased activity tolerance, decreased strength, increased fascial restrictions, impaired perceived functional  ability, and pain.   ACTIVITY LIMITATIONS: squatting and locomotion level  PARTICIPATION LIMITATIONS: community activity and recreation  PERSONAL FACTORS: Time since onset of injury/illness/exacerbation are also affecting patient's functional outcome.   REHAB POTENTIAL: Good  CLINICAL DECISION MAKING: Stable/uncomplicated  EVALUATION COMPLEXITY: Low   GOALS: Goals reviewed with patient? Yes  SHORT TERM GOALS: Target date: 11/13/2023    The patient will demonstrate knowledge of basic self care strategies and exercises to promote healing  Baseline: Goal status: INITIAL  2.  The patient will have improved hip strength to at least 4/5 needed for standing, walking longer distances  Baseline:  Goal status: INITIAL  3.  Able to single leg stand on right 10 sec without pelvic drop Baseline:  Goal status: INITIAL       LONG TERM GOALS: Target date:12/25/2023      The patient will be independent in a safe self progression of a home exercise program to promote further recovery  of function  Baseline:  Goal status: INITIAL  2.  The patient will have improved hip strength to at least 4+/5 needed for standing, walking longer distances Baseline:  Goal status: INITIAL  3.  Able to do a full squat without compensatory shifting Baseline:  Goal status: INITIAL  4.  Able to walk 3 miles for exercise/recreation Baseline:  Goal status: INITIAL  5.  The patient will have improved FOTO score to   76%    indicating improved function with less pain  Baseline:  Goal status: INITIAL    PLAN:  PT FREQUENCY: every other week  PT DURATION: 12 weeks  PLANNED INTERVENTIONS: 97164- PT Re-evaluation, 97110-Therapeutic exercises, 97530- Therapeutic activity, 97112- Neuromuscular re-education, 97535- Self Care, 16109- Manual therapy, [id number]- Aquatic Therapy, 97014- Electrical stimulation (unattended), [phone]- Electrical stimulation (manual), [id number]- Ultrasound, 09811- Ionotophoresis 4mg /ml Dexamethasone, Taping, Dry Needling, Joint mobilization, Moist heat, and Biofeedback  PLAN FOR NEXT SESSION: SLS without pelvic drop and single leg progression, bird dogs, side planks on knees with clams or drivers; staggered stance dead lifts; glute medius specific ex; squats; needle phobic  Lavinia Sharps, PT 10/17/23 1:06 PM Phone: [phone] Fax: [phone]

## 2023-10-30 ENCOUNTER — Ambulatory Visit: Payer: BC Managed Care – PPO | Admitting: Physical Therapy

## 2023-11-14 ENCOUNTER — Encounter: Payer: BC Managed Care – PPO | Admitting: Physical Therapy

## 2023-11-20 ENCOUNTER — Ambulatory Visit: Payer: BC Managed Care – PPO | Attending: Family Medicine | Admitting: Physical Therapy

## 2023-11-20 DIAGNOSIS — M6281 Muscle weakness (generalized): Secondary | ICD-10-CM | POA: Diagnosis not present

## 2023-11-20 DIAGNOSIS — M25651 Stiffness of right hip, not elsewhere classified: Secondary | ICD-10-CM | POA: Diagnosis not present

## 2023-11-20 DIAGNOSIS — M25551 Pain in right hip: Secondary | ICD-10-CM | POA: Diagnosis not present

## 2023-11-20 NOTE — Therapy (Signed)
 OUTPATIENT PHYSICAL THERAPY LOWER EXTREMITY PROGRESS NOTE   Patient Name: Stephanie Walton MRN: [id number] DOB:1970-11-07, 53 y.o., female Today's Date: 11/20/2023  END OF SESSION:  PT End of Session - 11/20/23 1527     Visit Number 3    Date for PT Re-Evaluation 11/27/23    Authorization Type BCBS    PT Start Time 1530    PT Stop Time 1610    PT Time Calculation (min) 40 min    Activity Tolerance Patient tolerated treatment well             No past medical history on file. No past surgical history on file. 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 Not injured. Doing layoffs at work. Vacation on Thursday. I haven't thought about my hip much. Just aware that I should move around more.  I have my yoga area set up now.    EVAL:New imaging shows Anterior superior right labral hip tear.  Symptoms present for years.   2 miles walking is the limit at one time.  Have to be careful with swing dancing.  Have appt with surgeon Dr. Jason Nest on the 23rd.   I joined a Engineer, site 30 min TM walking, leg press (feels good),hip abductor/adductor 55#, lats, row; PVC trunk rotation; leg swings;  I haven't done my other home ex's in a while but going to start back Standing abdominal leg lifts will produce popping right hip   PERTINENT HISTORY: History of Right ankle peroneal tendonitis History of disc bulge PAIN:   Are you having pain? no NPRS scale:  0/10 Pain location: right buttock lateral; feels not symmetrical, right leg wants to turn in Pain  orientation: Right  Pain description: intermittent  Aggravating factors: walking 2 miles, popping with certain movements Relieving factors: some of the exercises  PRECAUTIONS: None    WEIGHT BEARING RESTRICTIONS: No  FALLS:  Has patient fallen in last 6 months? No LIVING ENVIRONMENT: Lives with: lives with their spouse Lives in: House/apartment Stairs: Yes: Internal: 16 steps; on right going up and External: 3 steps; on right going up Has following equipment at home: None  OCCUPATION: full time, international travel, standing desk and lab work   PLOF: Independent  PATIENT GOALS: determine if surgery is needed or if PT can manage this problem  NEXT MD VISIT: consult with surgeon Dr. Jason Nest later this month  OBJECTIVE:  Note: Objective measures were completed at Evaluation unless otherwise noted.  DIAGNOSTIC FINDINGS: 08/29/23: MR OF THE RIGHT HIP WITHOUT CONTRAST   TECHNIQUE: Multiplanar, multisequence MR imaging was performed. No intravenous contrast was administered.   COMPARISON:  None Available.   FINDINGS: Bones: Unremarkable   Articular cartilage and labrum   Articular cartilage:  Unremarkable   Labrum:  Torn anterior superior labrum, image 17 series 13.   Joint or bursal effusion   Joint effusion:  Absent   Bursae: No regional bursitis.   Muscles and tendons   Muscles and tendons: Mild band of edema anterolaterally in the gluteus medius muscle, images 5 through 12 series  11, potentially from muscle strain.   Other findings   Miscellaneous:   No sciatic notch impingement.   IMPRESSION: 1. Torn right acetabular anterior superior labrum. 2. Mild band of edema anterolaterally in the gluteus medius muscle, potentially from muscle strain.      PATIENT SURVEYS:  FOTO 67%  COGNITION: Overall cognitive status: Within functional limits for tasks assessed     PALPATION: Rt  glut min, glut med, piriformis tender to palpation  LOWER EXTREMITY ROM:   full and symmetrical hip ROM although pt is fearful with passive hip internal and external rotation; no pain with full knee to chest hip flexion;  decreased right hip flexor length noted in sidelying  LOWER EXTREMITY MMT:  right hip abduction 4/5;  right hip flexion and extension 4+/5  TRUNK STRENGTH:  Decreased activation of transverse abdominus muscles; abdominals 4/5; decreased activation of lumbar multifidi; trunk extensors 4/5 Decreased stability noted with bird dogs (WB on right knee)   LOWER EXTREMITY SPECIAL TESTS:  Immediate Pelvic drop with SLS on right   FUNCTIONAL TESTS:  Able to do a full squat however shifts toward the left on descent and rise  GAIT: Comments: WNLs for clinic ambulation  TREATMENT DATE: 11/20/23  Bike 8 min L3 while during status update SLS in front of the mirror progression to "stand tall" activating gluteals 10x right/left Bird dogs 5x with added green band foot to opposite hand (given band for home) Front planks 10 sec 5x Side planks 5 sec hold 5x right/left (top arm pointing to ceiling) Bridge 5x double Single leg bridge holding small green ball in the groin 5x right/left 5 sec hold Dead lifts 10# kettlebell 10x, increased to 15# Kettlebell 10x Goblet Squats with 10# 5x; purple band for right lateral hip distraction 5x  Staggered stance dead lift with 10# kettlebell 10x right/left needs some UE support (trial of purple band for lateral distraction no improvement in sensation) Doubled green band above thighs with hip hinge hip clocks 10x right/left  ("this is a workout but a good one")  Therapeutic activity: standing, lifting, squatting                                                                                                                         TREATMENT DATE: 10/17/23  Bike 6 min L4 while during status update SLS in front of the mirror progression to "stand tall" activating gluteals 10x right/left Bird dogs alternating sides 10x (added to  HEP) Side planks 5 sec hold 5x right/left (added to HEP) Side planks with clam 5x right/left (added to HEP) Dead lifts 10# kettlebell 10x, increased to 15# Kettlebell 10x Goblet Squats with 10# 10x Discussed use of leg press at the gym including single leg Staggered stance dead lift with 10# kettlebell (very challenging on right and needs some UE support and limited ROM to knee level) (added to HEP) Doubled green band above thighs with hip hinge hip clocks 10x right/left (added to HEP)  PATIENT EDUCATION:  Education details: Educated patient on anatomy and physiology of current symptoms, prognosis, plan of care as well as initial self care strategies to promote recovery Person educated: Patient Education method: Explanation Education comprehension: verbalized understanding  HOME EXERCISE PROGRAM: Access Code: [email or web address] URL: [email or web address] Date: 10/17/2023 Prepared by: Lavinia Sharps Most recent ex's given at the top of the list: used Medbridge access code from prior course of PT Exercises - HIP CLOCKS  - 1 x daily - 7 x weekly - 1 sets - 10 reps - Single Leg Deadlift with Kettlebell  - 1 x daily - 7 x weekly - 1 sets - 10 reps - Side Plank with Clam  - 1 x daily - 7 x weekly - 1 sets - 5 reps - Side Plank on Knees  - 1 x daily - 7 x weekly - 1 sets - 5 reps - Bird Dog  - 1 x daily - 7 x weekly - 1 sets - 10 reps - Goblet Squat with Kettlebell  - 1 x daily - 7 x weekly - 2 sets - 5 reps - Single-Leg [place] Deadlift With Kettlebell  - 1 x daily - 7 x weekly - 1 sets - 10 reps - Runner's Climb  - 1 x daily - 7 x weekly - 1 sets - 10 reps - 3 hold - Side Stepping with Resistance at Thighs  - 1 x daily - 7 x weekly - 3 sets - 10 reps - Backward Monster Walk with Resistance (BKA)  - 1 x daily - 7 x weekly - 3 sets - 10 reps - Forward Monster Walk with Resistance (BKA)  - 1 x daily - 7 x weekly - 3 sets - 10 reps - Single Leg Lunge with Foot on Bench  - 1 x daily - 7  x weekly - 1 sets - 10 reps - Lateral Lunge  - 1 x daily - 7 x weekly - 1 sets - 10 reps - Modified Thomas Stretch  - 1 x daily - 7 x weekly - 2 sets - 2 reps - 30 hold - Supine Lower Trunk Rotation  - 1 x daily - 7 x weekly - 2 sets - 10 reps - Supine Bridge  - 1 x daily - 7 x weekly - 2 sets - 10 reps - Clamshell with Resistance  - 1 x daily - 7 x weekly - 2 sets - 10 reps - Supine Dead Bug with Leg Extension  - 1 x daily - 7 x weekly - 2 sets - 10 reps - Abdominal Bracing  - 1 x daily - 7 x weekly - 2 sets - 10 reps - Side Lunge Adductor Stretch  - 1 x daily - 7 x weekly - 2 sets - 2 reps - 30 hold - Standing Hip Flexor Stretch  - 1 x daily - 7 x weekly - 2 sets - 2 reps - 30 hold - Latissimus Dorsi Stretch at Wall  - 1 x daily - 7 x weekly - 2 sets - 2 reps - 30 hold - Pigeon Pose  - 2 x daily - 7 x weekly - 1 sets - 2 reps - 30 hold - Hooklying Isometric Hip Flexion  - 1 x daily - 7 x weekly - 1 sets - 5 reps - 5 hold - Figure 4 Bridge  - 1 x daily - 7 x weekly - 2 sets - 5 reps - Beginner Front Arm Support  - 1  x daily - 7 x weekly - 2 sets - 5 reps - 3 hold - Single Leg Balance with Opposite Leg Star Reach  - 1 x daily - 7 x weekly - 2 sets - 5 reps - Sidelying Hip Abduction  - 1 x daily - 7 x weekly - 1 sets - 10 reps - Quadruped on Forearms Bent Knee Hip Extension  - 1 x daily - 7 x weekly - 1 sets - 10 reps   ASSESSMENT:  CLINICAL IMPRESSION: Tuyen returns after a gap in care while she traveled outside the country.  She reports she was not impaired by her hip while travelling. Able to progress some of her HEP including resisted banded bird dogs and transition from double leg bridges to single leg bridges.  She has some discomfort with staggered stance dead lifts on the right even with banded lateral distraction and smaller ROM today.  Therapist providing verbal cues to optimize technique with  exercises in order to achieve the greatest benefit.    OBJECTIVE IMPAIRMENTS: decreased  activity tolerance, decreased strength, increased fascial restrictions, impaired perceived functional ability, and pain.   ACTIVITY LIMITATIONS: squatting and locomotion level  PARTICIPATION LIMITATIONS: community activity and recreation  PERSONAL FACTORS: Time since onset of injury/illness/exacerbation are also affecting patient's functional outcome.   REHAB POTENTIAL: Good  CLINICAL DECISION MAKING: Stable/uncomplicated  EVALUATION COMPLEXITY: Low   GOALS: Goals reviewed with patient? Yes  SHORT TERM GOALS: Target date: 11/13/2023    The patient will demonstrate knowledge of basic self care strategies and exercises to promote healing  Baseline: Goal status: met 2/25 2.  The patient will have improved hip strength to at least 4/5 needed for standing, walking longer distances  Baseline:  Goal status: met 2/25  3.  Able to single leg stand on right 10 sec without pelvic drop Baseline:  Goal status: met 2/25      LONG TERM GOALS: Target date:12/25/2023      The patient will be independent in a safe self progression of a home exercise program to promote further recovery of function  Baseline:  Goal status: INITIAL  2.  The patient will have improved hip strength to at least 4+/5 needed for standing, walking longer distances Baseline:  Goal status: INITIAL  3.  Able to do a full squat without compensatory shifting Baseline:  Goal status: INITIAL  4.  Able to walk 3 miles for exercise/recreation Baseline:  Goal status: INITIAL  5.  The patient will have improved FOTO score to   76%    indicating improved function with less pain  Baseline:  Goal status: INITIAL    PLAN:  PT FREQUENCY: every other week  PT DURATION: 12 weeks  PLANNED INTERVENTIONS: 97164- PT Re-evaluation, 97110-Therapeutic exercises, 97530- Therapeutic activity, 97112- Neuromuscular re-education, 97535- Self Care, 16109- Manual therapy, [id number]- Aquatic Therapy, 97014- Electrical stimulation  (unattended), [phone]- Electrical stimulation (manual), [id number]- Ultrasound, 91478- Ionotophoresis 4mg /ml Dexamethasone, Taping, Dry Needling, Joint mobilization, Moist heat, and Biofeedback  PLAN FOR NEXT SESSION:follow up after trip to [place];  SLS without pelvic drop and single leg progression, bird dogs, side planks on knees with clams or drivers; staggered stance dead lifts; glute medius specific ex; squats; needle phobic  Lavinia Sharps, PT 11/20/23 5:04 PM Phone: [phone] Fax: [phone]

## 2023-11-27 ENCOUNTER — Ambulatory Visit: Payer: BC Managed Care – PPO | Attending: Family Medicine | Admitting: Physical Therapy

## 2023-11-27 DIAGNOSIS — M6281 Muscle weakness (generalized): Secondary | ICD-10-CM | POA: Insufficient documentation

## 2023-11-27 DIAGNOSIS — M25651 Stiffness of right hip, not elsewhere classified: Secondary | ICD-10-CM | POA: Insufficient documentation

## 2023-11-27 DIAGNOSIS — M25551 Pain in right hip: Secondary | ICD-10-CM | POA: Diagnosis not present

## 2023-11-27 NOTE — Therapy (Signed)
 OUTPATIENT PHYSICAL THERAPY LOWER EXTREMITY PROGRESS NOTE/RECERTIFICATION   Patient Name: Stephanie Walton MRN: [id number] DOB:1971/08/22, 53 y.o., female Today's Date: 11/27/2023  END OF SESSION:  PT End of Session - 11/27/23 1539     Visit Number 4    Date for PT Re-Evaluation 01/22/24    Authorization Type [employer]    PT Start Time 1535    PT Stop Time 1615    PT Time Calculation (min) 40 min    Activity Tolerance Patient tolerated treatment well             No past medical history on file. No past surgical history on file. 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 I haven't been on a hike yet.  2 mile is still the limit.  Can log 5 miles in a day total with rests here and there.  Going to see Dr. Jason Nest this Thursday.  EVAL:New imaging shows Anterior superior right labral hip tear.  Symptoms present for years.   2 miles walking is the limit at one time.  Have to be careful with swing dancing.  Have appt with surgeon Dr. Jason Nest on the 23rd.   I joined a Engineer, site 30 min TM walking, leg press (feels good),hip abductor/adductor 55#, lats, row; PVC trunk rotation; leg swings;  I haven't done my other home ex's in a while but going to start back Standing abdominal leg lifts will produce popping right hip   PERTINENT HISTORY: History of Right ankle peroneal tendonitis History of disc bulge PAIN:   Are you having pain? no NPRS scale:  0/10 Pain location: right buttock lateral; feels not symmetrical, right leg  wants to turn in Pain orientation: Right  Pain description: intermittent  Aggravating factors: walking 2 miles, popping with certain movements Relieving factors: some of the exercises  PRECAUTIONS: None    WEIGHT BEARING RESTRICTIONS: No  FALLS:  Has patient fallen in last 6 months? No LIVING ENVIRONMENT: Lives with: lives with their spouse Lives in: House/apartment Stairs: Yes: Internal: 16 steps; on right going up and External: 3 steps; on right going up Has following equipment at home: None  OCCUPATION: full time, international travel, standing desk and lab work   PLOF: Independent  PATIENT GOALS: determine if surgery is needed or if PT can manage this problem  NEXT MD VISIT: consult with surgeon Dr. Jason Nest later this month  OBJECTIVE:  Note: Objective measures were completed at Evaluation unless otherwise noted.  DIAGNOSTIC FINDINGS: 08/29/23: MR OF THE RIGHT HIP WITHOUT CONTRAST   TECHNIQUE: Multiplanar, multisequence MR imaging was performed. No intravenous contrast was administered.   COMPARISON:  None Available.   FINDINGS: Bones: Unremarkable   Articular cartilage and labrum   Articular cartilage:  Unremarkable   Labrum:  Torn anterior superior labrum, image 17 series 13.   Joint or bursal effusion   Joint effusion:  Absent   Bursae: No regional bursitis.   Muscles and tendons   Muscles and tendons: Mild band of edema anterolaterally in the gluteus medius muscle, images 5  through 12 series 11, potentially from muscle strain.   Other findings   Miscellaneous:   No sciatic notch impingement.   IMPRESSION: 1. Torn right acetabular anterior superior labrum. 2. Mild band of edema anterolaterally in the gluteus medius muscle, potentially from muscle strain.      PATIENT SURVEYS:  FOTO 67% 3/4: 73%  COGNITION: Overall cognitive status: Within functional limits for tasks assessed     PALPATION: Rt  glut min, glut med, piriformis tender to  palpation  LOWER EXTREMITY ROM:  full and symmetrical hip ROM although pt is fearful with passive hip internal and external rotation; no pain with full knee to chest hip flexion;  decreased right hip flexor length noted in sidelying  LOWER EXTREMITY MMT:  right hip abduction 4/5;  right hip flexion and extension 4+/5  TRUNK STRENGTH:  Decreased activation of transverse abdominus muscles; abdominals 4/5; decreased activation of lumbar multifidi; trunk extensors 4/5 Decreased stability noted with bird dogs (WB on right knee)   LOWER EXTREMITY SPECIAL TESTS:  Immediate Pelvic drop with SLS on right   FUNCTIONAL TESTS:  Able to do a full squat however shifts toward the left on descent and rise  GAIT: Comments: WNLs for clinic ambulation TREATMENT DATE: 11/27/23  Bike 12 min L2 while during status update SLS in front of the mirror progression to "stand tall" activating gluteals 10x right/left Bird dogs 5x with added green band foot to opposite hand (given band for home) Side planks 5 sec hold 5x right/left (top arm pointing to ceiling) Single leg bridge holding small green ball in the groin 5x right/left 5 sec hold Dead lifts pair 9# kettlebell 10x, increased to 15# Kettlebell 10x Single leg RDL 9#  10x(smaller ROM on right) Goblet Squats with 10# 5x; purple band for right lateral hip distraction 5x  26 inch box single leg sit to stands 8x 2 (needs a little assistance of opp side) Doubled green band above thighs with hip hinge hip clocks  5 pulses 10x right/left   Red loop above knee drivers with calf raise 78I right/left (no hip clicking) Cable resisted side step 10x right/left 10#  Therapeutic activity: standing, lifting, squatting      TREATMENT DATE: 11/20/23  Bike 8 min L3 while during status update SLS in front of the mirror progression to "stand tall" activating gluteals 10x right/left Bird dogs 5x with added green band foot to opposite hand (given band for home) Front planks 10  sec 5x Side planks 5 sec hold 5x right/left (top arm pointing to ceiling) Bridge 5x double Single leg bridge holding small green ball in the groin 5x right/left 5 sec hold Dead lifts 10# kettlebell 10x, increased to 15# Kettlebell 10x Goblet Squats with 10# 5x; purple band for right lateral hip distraction 5x  Staggered stance dead lift with 10# kettlebell 10x right/left needs some UE support (trial of purple band for lateral distraction no improvement in sensation) Doubled green band above thighs with hip hinge hip clocks 10x right/left  ("this is a workout but a good one")  Therapeutic activity: standing, lifting, squatting  TREATMENT DATE: 10/17/23  Bike 6 min L4 while during status update SLS in front of the mirror progression to "stand tall" activating gluteals 10x right/left Bird dogs alternating sides 10x (added to HEP) Side planks 5 sec hold 5x right/left (added to HEP) Side planks with clam 5x right/left (added to HEP) Dead lifts 10# kettlebell 10x, increased to 15# Kettlebell 10x Goblet Squats with 10# 10x Discussed use of leg press at the gym including single leg Staggered stance dead lift with 10# kettlebell (very challenging on right and needs some UE support and limited ROM to knee level) (added to HEP) Doubled green band above thighs with hip hinge hip clocks 10x right/left (added to HEP)   PATIENT EDUCATION:  Education details: Educated patient on anatomy and physiology of current symptoms, prognosis, plan of care as well as initial self care strategies to promote recovery Person educated: Patient Education method: Explanation Education comprehension: verbalized understanding  HOME EXERCISE PROGRAM: Access Code: [email or web address] URL: [email or web address] Date: 10/17/2023 Prepared by: Lavinia Sharps Most recent ex's given at the top of the list:  used Medbridge access code from prior course of PT Exercises - HIP CLOCKS  - 1 x daily - 7 x weekly - 1 sets - 10 reps - Single Leg Deadlift with Kettlebell  - 1 x daily - 7 x weekly - 1 sets - 10 reps - Side Plank with Clam  - 1 x daily - 7 x weekly - 1 sets - 5 reps - Side Plank on Knees  - 1 x daily - 7 x weekly - 1 sets - 5 reps - Bird Dog  - 1 x daily - 7 x weekly - 1 sets - 10 reps - Goblet Squat with Kettlebell  - 1 x daily - 7 x weekly - 2 sets - 5 reps - Single-Leg [place] Deadlift With Kettlebell  - 1 x daily - 7 x weekly - 1 sets - 10 reps - Runner's Climb  - 1 x daily - 7 x weekly - 1 sets - 10 reps - 3 hold - Side Stepping with Resistance at Thighs  - 1 x daily - 7 x weekly - 3 sets - 10 reps - Backward Monster Walk with Resistance (BKA)  - 1 x daily - 7 x weekly - 3 sets - 10 reps - Forward Monster Walk with Resistance (BKA)  - 1 x daily - 7 x weekly - 3 sets - 10 reps - Single Leg Lunge with Foot on Bench  - 1 x daily - 7 x weekly - 1 sets - 10 reps - Lateral Lunge  - 1 x daily - 7 x weekly - 1 sets - 10 reps - Modified Thomas Stretch  - 1 x daily - 7 x weekly - 2 sets - 2 reps - 30 hold - Supine Lower Trunk Rotation  - 1 x daily - 7 x weekly - 2 sets - 10 reps - Supine Bridge  - 1 x daily - 7 x weekly - 2 sets - 10 reps - Clamshell with Resistance  - 1 x daily - 7 x weekly - 2 sets - 10 reps - Supine Dead Bug with Leg Extension  - 1 x daily - 7 x weekly - 2 sets - 10 reps - Abdominal Bracing  - 1 x daily - 7 x weekly - 2 sets - 10 reps - Side Lunge Adductor Stretch  - 1 x daily - 7 x weekly - 2 sets - 2 reps -  30 hold - Standing Hip Flexor Stretch  - 1 x daily - 7 x weekly - 2 sets - 2 reps - 30 hold - Latissimus Dorsi Stretch at Wall  - 1 x daily - 7 x weekly - 2 sets - 2 reps - 30 hold - Pigeon Pose  - 2 x daily - 7 x weekly - 1 sets - 2 reps - 30 hold - Hooklying Isometric Hip Flexion  - 1 x daily - 7 x weekly - 1 sets - 5 reps - 5 hold - Figure 4 Bridge  - 1 x daily - 7  x weekly - 2 sets - 5 reps - Beginner Front Arm Support  - 1 x daily - 7 x weekly - 2 sets - 5 reps - 3 hold - Single Leg Balance with Opposite Leg Star Reach  - 1 x daily - 7 x weekly - 2 sets - 5 reps - Sidelying Hip Abduction  - 1 x daily - 7 x weekly - 1 sets - 10 reps - Quadruped on Forearms Bent Knee Hip Extension  - 1 x daily - 7 x weekly - 1 sets - 10 reps   ASSESSMENT:  CLINICAL IMPRESSION: FOTO functional outcome score improving.  Steady progression of hip and lumbo/pelvic strengthening and stabilization without pain exacerbation. Asymmetry persists between right/left particularly glute medius and quad muscles.  The patient would benefit from a continuation of skilled PT for a further progression of strengthening and functional mobility.  Will continue to update and promote independence in a HEP needed for a return to the highest functional level possible with ADLs.     OBJECTIVE IMPAIRMENTS: decreased activity tolerance, decreased strength, increased fascial restrictions, impaired perceived functional ability, and pain.   ACTIVITY LIMITATIONS: squatting and locomotion level  PARTICIPATION LIMITATIONS: community activity and recreation  PERSONAL FACTORS: Time since onset of injury/illness/exacerbation are also affecting patient's functional outcome.   REHAB POTENTIAL: Good  CLINICAL DECISION MAKING: Stable/uncomplicated  EVALUATION COMPLEXITY: Low   GOALS: Goals reviewed with patient? Yes  SHORT TERM GOALS: Target date: 11/13/2023    The patient will demonstrate knowledge of basic self care strategies and exercises to promote healing  Baseline: Goal status: met 2/25 2.  The patient will have improved hip strength to at least 4/5 needed for standing, walking longer distances  Baseline:  Goal status: met 2/25  3.  Able to single leg stand on right 10 sec without pelvic drop Baseline:  Goal status: met 2/25      LONG TERM GOALS: Target date:01/22/2024       The patient will be independent in a safe self progression of a home exercise program to promote further recovery of function  Baseline:  Goal status: ongoing  2.  The patient will have improved hip strength to at least 4+/5 needed for standing, walking longer distances Baseline:  Goal status: ongoing  3.  Able to do a full squat without compensatory shifting Baseline:  Goal status: ongoing  4.  Able to walk 3 miles for exercise/recreation Baseline:  Goal status: ongoing 5.  The patient will have improved FOTO score to   76%    indicating improved function with less pain  Baseline:  Goal status: ongoing    PLAN:  PT FREQUENCY: every other week  PT DURATION: 12 weeks  PLANNED INTERVENTIONS: 97164- PT Re-evaluation, 97110-Therapeutic exercises, 97530- Therapeutic activity, [id number]- Neuromuscular re-education, 97535- Self Care, 16109- Manual therapy, [id number]- Aquatic Therapy, 97014- Electrical stimulation (unattended), [id number]- Electrical  stimulation (manual), 21308- Ultrasound, [id number]- Ionotophoresis 4mg /ml Dexamethasone, Taping, Dry Needling, Joint mobilization, Moist heat, and Biofeedback  PLAN FOR NEXT SESSION:   ask about appt with Dr. Jason Nest; SLS without pelvic drop and single leg progression, bird dogs, side planks on knees with clams or drivers; staggered stance dead lifts; glute medius specific ex; squats; needle phobic  Lavinia Sharps, PT 11/27/23 4:38 PM Phone: [phone] Fax: [phone]

## 2023-11-28 ENCOUNTER — Encounter: Payer: BC Managed Care – PPO | Admitting: Physical Therapy

## 2023-11-29 DIAGNOSIS — M21851 Other specified acquired deformities of right thigh: Secondary | ICD-10-CM | POA: Diagnosis not present

## 2023-11-29 DIAGNOSIS — M25551 Pain in right hip: Secondary | ICD-10-CM | POA: Diagnosis not present

## 2023-12-18 ENCOUNTER — Ambulatory Visit: Payer: BC Managed Care – PPO | Admitting: Physical Therapy

## 2023-12-18 DIAGNOSIS — M6281 Muscle weakness (generalized): Secondary | ICD-10-CM

## 2023-12-18 DIAGNOSIS — M25551 Pain in right hip: Secondary | ICD-10-CM | POA: Diagnosis not present

## 2023-12-18 DIAGNOSIS — M25651 Stiffness of right hip, not elsewhere classified: Secondary | ICD-10-CM | POA: Diagnosis not present

## 2023-12-18 NOTE — Therapy (Signed)
 OUTPATIENT PHYSICAL THERAPY LOWER EXTREMITY PROGRESS NOTE   Patient Name: Stephanie Walton MRN: [id number] DOB:Dec 30, 1970, 53 y.o., female Today's Date: 12/18/2023  END OF SESSION:  PT End of Session - 12/18/23 1536     Visit Number 5    Date for PT Re-Evaluation 01/22/24    Authorization Type [employer]    PT Start Time 1533    PT Stop Time 1614    PT Time Calculation (min) 41 min    Activity Tolerance Patient tolerated treatment well             No past medical history on file. No past surgical history on file. 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 Jason Nest and not a hip labrum surgical candidate which I think is really good news. He said I might need a THR when I'm in my 36s but maybe not.  He recommended PT. Walked 20 blocks in [place] and it went really well.  I've been doing my exercises when I'm not traveling.   EVAL:New imaging shows Anterior superior right labral hip tear.  Symptoms present for years.   2 miles walking is the limit at one time.  Have to be careful with swing dancing.  Have appt with surgeon Dr. Jason Nest on the 23rd.   I joined a Engineer, site 30 min TM walking, leg press (feels good),hip abductor/adductor 55#, lats, row; PVC trunk rotation; leg swings;  I haven't done my other home ex's in a while but going to start back Standing abdominal leg lifts will produce popping right hip   PERTINENT HISTORY: History of Right ankle peroneal tendonitis History of disc bulge PAIN:   Are you having pain? no NPRS scale:  0/10 Pain location: right buttock lateral; feels  not symmetrical, right leg wants to turn in Pain orientation: Right  Pain description: intermittent  Aggravating factors: walking 2 miles, popping with certain movements Relieving factors: some of the exercises  PRECAUTIONS: None    WEIGHT BEARING RESTRICTIONS: No  FALLS:  Has patient fallen in last 6 months? No LIVING ENVIRONMENT: Lives with: lives with their spouse Lives in: House/apartment Stairs: Yes: Internal: 16 steps; on right going up and External: 3 steps; on right going up Has following equipment at home: None  OCCUPATION: full time, international travel, standing desk and lab work   PLOF: Independent  PATIENT GOALS: determine if surgery is needed or if PT can manage this problem  NEXT MD VISIT: consult with surgeon Dr. Jason Nest later this month  OBJECTIVE:  Note: Objective measures were completed at Evaluation unless otherwise noted.  DIAGNOSTIC FINDINGS: 08/29/23: MR OF THE RIGHT HIP WITHOUT CONTRAST   TECHNIQUE: Multiplanar, multisequence MR imaging was performed. No intravenous contrast was administered.   COMPARISON:  None Available.   FINDINGS: Bones: Unremarkable   Articular cartilage and labrum   Articular cartilage:  Unremarkable   Labrum:  Torn anterior superior labrum, image 17 series 13.   Joint or bursal effusion   Joint effusion:  Absent   Bursae: No regional bursitis.   Muscles and tendons   Muscles and tendons: Mild band  of edema anterolaterally in the gluteus medius muscle, images 5 through 12 series 11, potentially from muscle strain.   Other findings   Miscellaneous:   No sciatic notch impingement.   IMPRESSION: 1. Torn right acetabular anterior superior labrum. 2. Mild band of edema anterolaterally in the gluteus medius muscle, potentially from muscle strain.      PATIENT SURVEYS:  FOTO 67% 3/4: 73%  COGNITION: Overall cognitive status: Within functional limits for tasks assessed     PALPATION: Rt  glut min, glut  med, piriformis tender to palpation  LOWER EXTREMITY ROM:  full and symmetrical hip ROM although pt is fearful with passive hip internal and external rotation; no pain with full knee to chest hip flexion;  decreased right hip flexor length noted in sidelying  LOWER EXTREMITY MMT:  right hip abduction 4/5;  right hip flexion and extension 4+/5  TRUNK STRENGTH:  Decreased activation of transverse abdominus muscles; abdominals 4/5; decreased activation of lumbar multifidi; trunk extensors 4/5 Decreased stability noted with bird dogs (WB on right knee)   LOWER EXTREMITY SPECIAL TESTS:  Immediate Pelvic drop with SLS on right   FUNCTIONAL TESTS:  Able to do a full squat however shifts toward the left on descent and rise  GAIT: Comments: WNLs for clinic ambulation TREATMENT DATE: 12/18/23  Bike 12 min L2 while during status update including consult with Dr. Jason Nest, discussed aquatic PT for return to jogging in a lower impact environment 10# plate single leg RDL with overhead press 2 sets of 5 right/left  Squats with 10# plate with overhead press  6 inch step ups with hip drive with 16# hold on right/left 2x10 Dead lifts  20# Kettlebell 7x Mat table plank shoulder tap/ with alternating hip extension 10x  Doubled green band above thighs with hip hinge hip clocks  5 pulses 10x right/left   Therapeutic activity: standing, lifting, squatting     TREATMENT DATE: 11/27/23  Bike 12 min L2 while during status update SLS in front of the mirror progression to "stand tall" activating gluteals 10x right/left Bird dogs 5x with added green band foot to opposite hand (given band for home) Side planks 5 sec hold 5x right/left (top arm pointing to ceiling) Single leg bridge holding small green ball in the groin 5x right/left 5 sec hold Dead lifts pair 9# kettlebell 10x, increased to 15# Kettlebell 10x Single leg RDL 9#  10x(smaller ROM on right) Goblet Squats with 10# 5x; purple band for right lateral hip  distraction 5x  26 inch box single leg sit to stands 8x 2 (needs a little assistance of opp side) Doubled green band above thighs with hip hinge hip clocks  5 pulses 10x right/left   Red loop above knee drivers with calf raise 10R right/left (no hip clicking) Cable resisted side step 10x right/left 10#  Therapeutic activity: standing, lifting, squatting      TREATMENT DATE: 11/20/23  Bike 8 min L3 while during status update SLS in front of the mirror progression to "stand tall" activating gluteals 10x right/left Bird dogs 5x with added green band foot to opposite hand (given band for home) Front planks 10 sec 5x Side planks 5 sec hold 5x right/left (top arm pointing to ceiling) Bridge 5x double Single leg bridge holding small green ball in the groin 5x right/left 5 sec hold Dead lifts 10# kettlebell 10x, increased to 15# Kettlebell 10x Goblet Squats with 10# 5x; purple band for right lateral hip distraction 5x  Staggered stance dead lift  with 10# kettlebell 10x right/left needs some UE support (trial of purple band for lateral distraction no improvement in sensation) Doubled green band above thighs with hip hinge hip clocks 10x right/left  ("this is a workout but a good one")  Therapeutic activity: standing, lifting, squatting                                                                                                                         TREATMENT DATE: 10/17/23  Bike 6 min L4 while during status update SLS in front of the mirror progression to "stand tall" activating gluteals 10x right/left Bird dogs alternating sides 10x (added to HEP) Side planks 5 sec hold 5x right/left (added to HEP) Side planks with clam 5x right/left (added to HEP) Dead lifts 10# kettlebell 10x, increased to 15# Kettlebell 10x Goblet Squats with 10# 10x Discussed use of leg press at the gym including single leg Staggered stance dead lift with 10# kettlebell (very challenging on right and needs some UE  support and limited ROM to knee level) (added to HEP) Doubled green band above thighs with hip hinge hip clocks 10x right/left (added to HEP)   PATIENT EDUCATION:  Education details: Educated patient on anatomy and physiology of current symptoms, prognosis, plan of care as well as initial self care strategies to promote recovery Person educated: Patient Education method: Explanation Education comprehension: verbalized understanding  HOME EXERCISE PROGRAM: Access Code: [email or web address] URL: [email or web address] Date: 10/17/2023 Prepared by: Lavinia Sharps Most recent ex's given at the top of the list: used Medbridge access code from prior course of PT Exercises - HIP CLOCKS  - 1 x daily - 7 x weekly - 1 sets - 10 reps - Single Leg Deadlift with Kettlebell  - 1 x daily - 7 x weekly - 1 sets - 10 reps - Side Plank with Clam  - 1 x daily - 7 x weekly - 1 sets - 5 reps - Side Plank on Knees  - 1 x daily - 7 x weekly - 1 sets - 5 reps - Bird Dog  - 1 x daily - 7 x weekly - 1 sets - 10 reps - Goblet Squat with Kettlebell  - 1 x daily - 7 x weekly - 2 sets - 5 reps - Single-Leg [place] Deadlift With Kettlebell  - 1 x daily - 7 x weekly - 1 sets - 10 reps - Runner's Climb  - 1 x daily - 7 x weekly - 1 sets - 10 reps - 3 hold - Side Stepping with Resistance at Thighs  - 1 x daily - 7 x weekly - 3 sets - 10 reps - Backward Monster Walk with Resistance (BKA)  - 1 x daily - 7 x weekly - 3 sets - 10 reps - Forward Monster Walk with Resistance (BKA)  - 1 x daily - 7 x weekly - 3 sets - 10 reps - Single Leg Lunge with Foot on Bench  - 1 x daily -  7 x weekly - 1 sets - 10 reps - Lateral Lunge  - 1 x daily - 7 x weekly - 1 sets - 10 reps - Modified Thomas Stretch  - 1 x daily - 7 x weekly - 2 sets - 2 reps - 30 hold - Supine Lower Trunk Rotation  - 1 x daily - 7 x weekly - 2 sets - 10 reps - Supine Bridge  - 1 x daily - 7 x weekly - 2 sets - 10 reps - Clamshell with Resistance  - 1 x daily - 7  x weekly - 2 sets - 10 reps - Supine Dead Bug with Leg Extension  - 1 x daily - 7 x weekly - 2 sets - 10 reps - Abdominal Bracing  - 1 x daily - 7 x weekly - 2 sets - 10 reps - Side Lunge Adductor Stretch  - 1 x daily - 7 x weekly - 2 sets - 2 reps - 30 hold - Standing Hip Flexor Stretch  - 1 x daily - 7 x weekly - 2 sets - 2 reps - 30 hold - Latissimus Dorsi Stretch at Wall  - 1 x daily - 7 x weekly - 2 sets - 2 reps - 30 hold - Pigeon Pose  - 2 x daily - 7 x weekly - 1 sets - 2 reps - 30 hold - Hooklying Isometric Hip Flexion  - 1 x daily - 7 x weekly - 1 sets - 5 reps - 5 hold - Figure 4 Bridge  - 1 x daily - 7 x weekly - 2 sets - 5 reps - Beginner Front Arm Support  - 1 x daily - 7 x weekly - 2 sets - 5 reps - 3 hold - Single Leg Balance with Opposite Leg Star Reach  - 1 x daily - 7 x weekly - 2 sets - 5 reps - Sidelying Hip Abduction  - 1 x daily - 7 x weekly - 1 sets - 10 reps - Quadruped on Forearms Bent Knee Hip Extension  - 1 x daily - 7 x weekly - 1 sets - 10 reps   ASSESSMENT:  CLINICAL IMPRESSION: Brittne demonstrates improved stability with right single leg standing exercises but quicker muscle fatigue compared to left side.  No pain but she does have some right hip clicking with repetitive hip flexion. Therapist providing verbal cues to optimize technique with  exercises in order to achieve the greatest benefit.  She is pleased that surgery was not recommended and exercise was recommended.  She is hopeful to do a weekend cycling event at the end of April and she expresses interested in aquatic PT to help with lower impact return to running/jogging.   OBJECTIVE IMPAIRMENTS: decreased activity tolerance, decreased strength, increased fascial restrictions, impaired perceived functional ability, and pain.   ACTIVITY LIMITATIONS: squatting and locomotion level  PARTICIPATION LIMITATIONS: community activity and recreation  PERSONAL FACTORS: Time since onset of  injury/illness/exacerbation are also affecting patient's functional outcome.   REHAB POTENTIAL: Good  CLINICAL DECISION MAKING: Stable/uncomplicated  EVALUATION COMPLEXITY: Low   GOALS: Goals reviewed with patient? Yes  SHORT TERM GOALS: Target date: 11/13/2023    The patient will demonstrate knowledge of basic self care strategies and exercises to promote healing  Baseline: Goal status: met 2/25 2.  The patient will have improved hip strength to at least 4/5 needed for standing, walking longer distances  Baseline:  Goal status: met 2/25  3.  Able to  single leg stand on right 10 sec without pelvic drop Baseline:  Goal status: met 2/25      LONG TERM GOALS: Target date:01/22/2024      The patient will be independent in a safe self progression of a home exercise program to promote further recovery of function  Baseline:  Goal status: ongoing  2.  The patient will have improved hip strength to at least 4+/5 needed for standing, walking longer distances Baseline:  Goal status: ongoing  3.  Able to do a full squat without compensatory shifting Baseline:  Goal status: ongoing  4.  Able to walk 3 miles for exercise/recreation Baseline:  Goal status: ongoing 5.  The patient will have improved FOTO score to   76%    indicating improved function with less pain  Baseline:  Goal status: ongoing    PLAN:  PT FREQUENCY: every other week  PT DURATION: 12 weeks  PLANNED INTERVENTIONS: 97164- PT Re-evaluation, 97110-Therapeutic exercises, 97530- Therapeutic activity, 97112- Neuromuscular re-education, 97535- Self Care, 16109- Manual therapy, [phone]- Aquatic Therapy, 97014- Electrical stimulation (unattended), [phone]- Electrical stimulation (manual), [id number]- Ultrasound, 91478- Ionotophoresis 4mg /ml Dexamethasone, Taping, Dry Needling, Joint mobilization, Moist heat, and Biofeedback  PLAN FOR NEXT SESSION: 2 aquatic PT visits for return jogging/ higher conditioning,  right  SLS without pelvic drop and single leg progression, bird dogs, side planks on knees with clams or drivers; staggered stance dead lifts; glute medius specific ex; squats; needle phobic  Lavinia Sharps, PT 12/18/23 4:28 PM Phone: [phone] Fax: [phone]

## 2023-12-26 ENCOUNTER — Ambulatory Visit: Attending: Family Medicine | Admitting: Physical Therapy

## 2023-12-26 ENCOUNTER — Encounter: Payer: Self-pay | Admitting: Physical Therapy

## 2023-12-26 DIAGNOSIS — M25551 Pain in right hip: Secondary | ICD-10-CM | POA: Insufficient documentation

## 2023-12-26 DIAGNOSIS — M25651 Stiffness of right hip, not elsewhere classified: Secondary | ICD-10-CM | POA: Insufficient documentation

## 2023-12-26 DIAGNOSIS — M6281 Muscle weakness (generalized): Secondary | ICD-10-CM | POA: Diagnosis not present

## 2023-12-26 DIAGNOSIS — R252 Cramp and spasm: Secondary | ICD-10-CM | POA: Diagnosis not present

## 2023-12-26 DIAGNOSIS — R293 Abnormal posture: Secondary | ICD-10-CM | POA: Insufficient documentation

## 2023-12-26 NOTE — Therapy (Signed)
 OUTPATIENT PHYSICAL THERAPY LOWER EXTREMITY PROGRESS NOTE   Patient Name: Stephanie Walton MRN: [id number] DOB:1971/05/10, 53 y.o., female Today's Date: 12/26/2023  END OF SESSION:  PT End of Session - 12/26/23 0903     Visit Number 6    Date for PT Re-Evaluation 01/22/24    Authorization Type BCBS    PT Start Time 0845    PT Stop Time 0929    PT Time Calculation (min) 44 min    Activity Tolerance Patient tolerated treatment well              History reviewed. No pertinent past medical history. History reviewed. No pertinent surgical history. 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   EVAL:New imaging shows Anterior superior right labral hip tear.  Symptoms present for years.   2 miles walking is the limit at one time.  Have to be careful with swing dancing.  Have appt with surgeon Dr. Jason Nest on the 23rd.   I joined a Engineer, site 30 min TM walking, leg press (feels good),hip abductor/adductor 55#, lats, row; PVC trunk rotation; leg swings;  I haven't done my other home ex's in a while but going to start back Standing abdominal leg lifts will produce popping right hip   PERTINENT HISTORY: History of Right ankle peroneal tendonitis History of disc bulge PAIN:   Are you having pain? no NPRS scale:  0/10 Pain location: right buttock lateral; feels not symmetrical, right leg wants to turn in Pain orientation: Right  Pain description: intermittent  Aggravating factors: walking 2 miles, popping with certain  movements Relieving factors: some of the exercises  PRECAUTIONS: None    WEIGHT BEARING RESTRICTIONS: No  FALLS:  Has patient fallen in last 6 months? No LIVING ENVIRONMENT: Lives with: lives with their spouse Lives in: House/apartment Stairs: Yes: Internal: 16 steps; on right going up and External: 3 steps; on right going up Has following equipment at home: None  OCCUPATION: full time, international travel, standing desk and lab work   PLOF: Independent  PATIENT GOALS: determine if surgery is needed or if PT can manage this problem  NEXT MD VISIT: consult with surgeon Dr. Jason Nest later this month  OBJECTIVE:  Note: Objective measures were completed at Evaluation unless otherwise noted.  DIAGNOSTIC FINDINGS: 08/29/23: MR OF THE RIGHT HIP WITHOUT CONTRAST   TECHNIQUE: Multiplanar, multisequence MR imaging was performed. No intravenous contrast was administered.   COMPARISON:  None Available.   FINDINGS: Bones: Unremarkable   Articular cartilage and labrum   Articular cartilage:  Unremarkable   Labrum:  Torn anterior superior labrum, image 17 series 13.   Joint or bursal effusion   Joint effusion:  Absent   Bursae: No regional bursitis.   Muscles and tendons   Muscles and tendons: Mild band of edema anterolaterally in the gluteus medius muscle, images 5 through 12 series 11, potentially from muscle strain.   Other findings   Miscellaneous:   No sciatic notch impingement.   IMPRESSION: 1. Torn right acetabular anterior superior labrum. 2.  Mild band of edema anterolaterally in the gluteus medius muscle, potentially from muscle strain.      PATIENT SURVEYS:  FOTO 67% 3/4: 73%  COGNITION: Overall cognitive status: Within functional limits for tasks assessed     PALPATION: Rt  glut min, glut med, piriformis tender to palpation  LOWER EXTREMITY ROM:  full and symmetrical hip ROM although pt is fearful with passive hip internal and external rotation;  no pain with full knee to chest hip flexion;  decreased right hip flexor length noted in sidelying  LOWER EXTREMITY MMT:  right hip abduction 4/5;  right hip flexion and extension 4+/5  TRUNK STRENGTH:  Decreased activation of transverse abdominus muscles; abdominals 4/5; decreased activation of lumbar multifidi; trunk extensors 4/5 Decreased stability noted with bird dogs (WB on right knee)   LOWER EXTREMITY SPECIAL TESTS:  Immediate Pelvic drop with SLS on right   FUNCTIONAL TESTS:  Able to do a full squat however shifts toward the left on descent and rise  GAIT: Comments: WNLs for clinic ambulation TREATMENT DATE:   12/26/23:Pt arrives for aquatic physical therapy. Treatment took place in 3.5-5.5 feet of water. Water temperature was 91 degrees F. Pt entered the pool via stairs independently. Pt requires buoyancy of water for support and to offload joints with strengthening exercises.  Pt utilizes viscosity of the water required for strengthening. 75% depth water walking with UE push/pull using single buoy wts. 10x in each direction. Single leg stance RT with LLE multiplanar movements. Horseback cycling 7 min underwater. Blue UE wts for extra buoyancy: vertical cross country ski LE 2x10.  12/18/23  Bike 12 min L2 while during status update including consult with Dr. Jason Nest, discussed aquatic PT for return to jogging in a lower impact environment 10# plate single leg RDL with overhead press 2 sets of 5 right/left  Squats with 10# plate with overhead press  6 inch step ups with hip drive with 10# hold on right/left 2x10 Dead lifts  20# Kettlebell 7x Mat table plank shoulder tap/ with alternating hip extension 10x  Doubled green band above thighs with hip hinge hip clocks  5 pulses 10x right/left   Therapeutic activity: standing, lifting, squatting     TREATMENT DATE: 11/27/23  Bike 12 min L2 while during status update SLS in front of the mirror progression to "stand tall" activating  gluteals 10x right/left Bird dogs 5x with added green band foot to opposite hand (given band for home) Side planks 5 sec hold 5x right/left (top arm pointing to ceiling) Single leg bridge holding small green ball in the groin 5x right/left 5 sec hold Dead lifts pair 9# kettlebell 10x, increased to 15# Kettlebell 10x Single leg RDL 9#  10x(smaller ROM on right) Goblet Squats with 10# 5x; purple band for right lateral hip distraction 5x  26 inch box single leg sit to stands 8x 2 (needs a little assistance of opp side) Doubled green band above thighs with hip hinge hip clocks  5 pulses 10x right/left   Red loop above knee drivers with calf raise 27O right/left (no hip clicking) Cable resisted side step 10x right/left 10#  Therapeutic activity: standing, lifting, squatting      PATIENT EDUCATION:  Education details: Educated patient on anatomy and physiology of current symptoms, prognosis, plan of care as well as initial self care strategies to promote recovery Person educated: Patient Education method: Explanation Education comprehension: verbalized understanding  HOME EXERCISE PROGRAM: Access Code: [email or web address] URL: [email or web address] Date: 10/17/2023 Prepared by: Kennyth Arnold  Simpson Most recent ex's given at the top of the list: used Medbridge access code from prior course of PT Exercises - HIP CLOCKS  - 1 x daily - 7 x weekly - 1 sets - 10 reps - Single Leg Deadlift with Kettlebell  - 1 x daily - 7 x weekly - 1 sets - 10 reps - Side Plank with Clam  - 1 x daily - 7 x weekly - 1 sets - 5 reps - Side Plank on Knees  - 1 x daily - 7 x weekly - 1 sets - 5 reps - Bird Dog  - 1 x daily - 7 x weekly - 1 sets - 10 reps - Goblet Squat with Kettlebell  - 1 x daily - 7 x weekly - 2 sets - 5 reps - Single-Leg [place] Deadlift With Kettlebell  - 1 x daily - 7 x weekly - 1 sets - 10 reps - Runner's Climb  - 1 x daily - 7 x weekly - 1 sets - 10 reps - 3 hold - Side Stepping with Resistance  at Thighs  - 1 x daily - 7 x weekly - 3 sets - 10 reps - Backward Monster Walk with Resistance (BKA)  - 1 x daily - 7 x weekly - 3 sets - 10 reps - Forward Monster Walk with Resistance (BKA)  - 1 x daily - 7 x weekly - 3 sets - 10 reps - Single Leg Lunge with Foot on Bench  - 1 x daily - 7 x weekly - 1 sets - 10 reps - Lateral Lunge  - 1 x daily - 7 x weekly - 1 sets - 10 reps - Modified Thomas Stretch  - 1 x daily - 7 x weekly - 2 sets - 2 reps - 30 hold - Supine Lower Trunk Rotation  - 1 x daily - 7 x weekly - 2 sets - 10 reps - Supine Bridge  - 1 x daily - 7 x weekly - 2 sets - 10 reps - Clamshell with Resistance  - 1 x daily - 7 x weekly - 2 sets - 10 reps - Supine Dead Bug with Leg Extension  - 1 x daily - 7 x weekly - 2 sets - 10 reps - Abdominal Bracing  - 1 x daily - 7 x weekly - 2 sets - 10 reps - Side Lunge Adductor Stretch  - 1 x daily - 7 x weekly - 2 sets - 2 reps - 30 hold - Standing Hip Flexor Stretch  - 1 x daily - 7 x weekly - 2 sets - 2 reps - 30 hold - Latissimus Dorsi Stretch at Wall  - 1 x daily - 7 x weekly - 2 sets - 2 reps - 30 hold - Pigeon Pose  - 2 x daily - 7 x weekly - 1 sets - 2 reps - 30 hold - Hooklying Isometric Hip Flexion  - 1 x daily - 7 x weekly - 1 sets - 5 reps - 5 hold - Figure 4 Bridge  - 1 x daily - 7 x weekly - 2 sets - 5 reps - Beginner Front Arm Support  - 1 x daily - 7 x weekly - 2 sets - 5 reps - 3 hold - Single Leg Balance with Opposite Leg Star Reach  - 1 x daily - 7 x weekly - 2 sets - 5 reps - Sidelying Hip Abduction  - 1 x daily - 7 x weekly -  1 sets - 10 reps - Quadruped on Forearms Bent Knee Hip Extension  - 1 x daily - 7 x weekly - 1 sets - 10 reps   ASSESSMENT:  CLINICAL IMPRESSION: Aniesha arrives with no hip pain. She has an aquatic background so is very familiar with moving and exercising in the water. Some education was given on how to use the different levels of buoyancy and water depth to her advantage or for progression. Pt verbally  understood all principles. Some minor clicking when cycling but pt was not concerned with it compared to others exercises where the hip clicked significantly.No pain in any activity today.   OBJECTIVE IMPAIRMENTS: decreased activity tolerance, decreased strength, increased fascial restrictions, impaired perceived functional ability, and pain.   ACTIVITY LIMITATIONS: squatting and locomotion level  PARTICIPATION LIMITATIONS: community activity and recreation  PERSONAL FACTORS: Time since onset of injury/illness/exacerbation are also affecting patient's functional outcome.   REHAB POTENTIAL: Good  CLINICAL DECISION MAKING: Stable/uncomplicated  EVALUATION COMPLEXITY: Low   GOALS: Goals reviewed with patient? Yes  SHORT TERM GOALS: Target date: 11/13/2023    The patient will demonstrate knowledge of basic self care strategies and exercises to promote healing  Baseline: Goal status: met 2/25 2.  The patient will have improved hip strength to at least 4/5 needed for standing, walking longer distances  Baseline:  Goal status: met 2/25  3.  Able to single leg stand on right 10 sec without pelvic drop Baseline:  Goal status: met 2/25      LONG TERM GOALS: Target date:01/22/2024      The patient will be independent in a safe self progression of a home exercise program to promote further recovery of function  Baseline:  Goal status: ongoing  2.  The patient will have improved hip strength to at least 4+/5 needed for standing, walking longer distances Baseline:  Goal status: ongoing  3.  Able to do a full squat without compensatory shifting Baseline:  Goal status: ongoing  4.  Able to walk 3 miles for exercise/recreation Baseline:  Goal status: ongoing 5.  The patient will have improved FOTO score to   76%    indicating improved function with less pain  Baseline:  Goal status: ongoing    PLAN:  PT FREQUENCY: every other week  PT DURATION: 12 weeks  PLANNED  INTERVENTIONS: 97164- PT Re-evaluation, 97110-Therapeutic exercises, 97530- Therapeutic activity, [id number]- Neuromuscular re-education, 97535- Self Care, [id number]- Manual therapy, [id number]- Aquatic Therapy, 97014- Electrical stimulation (unattended), [id number]- Electrical stimulation (manual), [id number]- Ultrasound, [id number]- Ionotophoresis 4mg /ml Dexamethasone, Taping, Dry Needling, Joint mobilization, Moist heat, and Biofeedback  PLAN FOR NEXT SESSION: Assess post aquatic, increase cycling time for proper preparation for her cycling trip end of month.  Ane Payment, PTA 12/26/23 9:30 AM

## 2024-01-01 ENCOUNTER — Encounter: Admitting: Physical Therapy

## 2024-01-02 ENCOUNTER — Encounter: Payer: Self-pay | Admitting: Physical Therapy

## 2024-01-02 ENCOUNTER — Ambulatory Visit: Admitting: Physical Therapy

## 2024-01-02 DIAGNOSIS — R252 Cramp and spasm: Secondary | ICD-10-CM | POA: Diagnosis not present

## 2024-01-02 DIAGNOSIS — M25551 Pain in right hip: Secondary | ICD-10-CM

## 2024-01-02 DIAGNOSIS — M25651 Stiffness of right hip, not elsewhere classified: Secondary | ICD-10-CM | POA: Diagnosis not present

## 2024-01-02 DIAGNOSIS — M6281 Muscle weakness (generalized): Secondary | ICD-10-CM | POA: Diagnosis not present

## 2024-01-02 DIAGNOSIS — R293 Abnormal posture: Secondary | ICD-10-CM

## 2024-01-02 NOTE — Therapy (Signed)
 OUTPATIENT PHYSICAL THERAPY LOWER EXTREMITY PROGRESS NOTE   Patient Name: Stephanie Walton MRN: [id number] DOB:[birth year], 53 y.o., female Today's Date: 01/02/2024  END OF SESSION:  PT End of Session - 01/02/24 0914     Visit Number 7    Date for PT Re-Evaluation 01/22/24    Authorization Type BCBS    PT Start Time 0845    PT Stop Time 0930    PT Time Calculation (min) 45 min    Activity Tolerance Patient tolerated treatment well    Behavior During Therapy [facility] for tasks assessed/performed              History reviewed. No pertinent past medical history. History reviewed. No pertinent surgical history. 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   EVAL:New imaging shows Anterior superior right labral hip tear.  Symptoms present for years.   2 miles walking is the limit at one time.  Have to be careful with swing dancing.  Have appt with surgeon Dr. Jason Nest on the 23rd.   I joined a Engineer, site 30 min TM walking, leg press (feels good),hip abductor/adductor 55#, lats, row; PVC trunk rotation; leg swings;  I haven't done my other home ex's in a while but going to start back Standing abdominal leg lifts will produce popping right hip   PERTINENT HISTORY: History of Right ankle peroneal tendonitis History of disc bulge PAIN:   Are you having pain? no NPRS scale:  0/10 Pain location: right buttock lateral; feels not symmetrical, right leg wants to  turn in Pain orientation: Right  Pain description: intermittent  Aggravating factors: walking 2 miles, popping with certain movements Relieving factors: some of the exercises  PRECAUTIONS: None    WEIGHT BEARING RESTRICTIONS: No  FALLS:  Has patient fallen in last 6 months? No LIVING ENVIRONMENT: Lives with: lives with their spouse Lives in: House/apartment Stairs: Yes: Internal: 16 steps; on right going up and External: 3 steps; on right going up Has following equipment at home: None  OCCUPATION: full time, international travel, standing desk and lab work   PLOF: Independent  PATIENT GOALS: determine if surgery is needed or if PT can manage this problem  NEXT MD VISIT: consult with surgeon Dr. Jason Nest later this month  OBJECTIVE:  Note: Objective measures were completed at Evaluation unless otherwise noted.  DIAGNOSTIC FINDINGS: 08/29/23: MR OF THE RIGHT HIP WITHOUT CONTRAST   TECHNIQUE: Multiplanar, multisequence MR imaging was performed. No intravenous contrast was administered.   COMPARISON:  None Available.   FINDINGS: Bones: Unremarkable   Articular cartilage and labrum   Articular cartilage:  Unremarkable   Labrum:  Torn anterior superior labrum, image 17 series 13.   Joint or bursal effusion   Joint effusion:  Absent   Bursae: No regional bursitis.   Muscles and tendons   Muscles and tendons: Mild band of edema anterolaterally in the gluteus medius muscle, images 5 through 12 series 11, potentially from muscle strain.  Other findings   Miscellaneous:   No sciatic notch impingement.   IMPRESSION: 1. Torn right acetabular anterior superior labrum. 2. Mild band of edema anterolaterally in the gluteus medius muscle, potentially from muscle strain.      PATIENT SURVEYS:  FOTO 67% 3/4: 73%  COGNITION: Overall cognitive status: Within functional limits for tasks assessed     PALPATION: Rt  glut min, glut med, piriformis tender to  palpation  LOWER EXTREMITY ROM:  full and symmetrical hip ROM although pt is fearful with passive hip internal and external rotation; no pain with full knee to chest hip flexion;  decreased right hip flexor length noted in sidelying  LOWER EXTREMITY MMT:  right hip abduction 4/5;  right hip flexion and extension 4+/5  TRUNK STRENGTH:  Decreased activation of transverse abdominus muscles; abdominals 4/5; decreased activation of lumbar multifidi; trunk extensors 4/5 Decreased stability noted with bird dogs (WB on right knee)   LOWER EXTREMITY SPECIAL TESTS:  Immediate Pelvic drop with SLS on right   FUNCTIONAL TESTS:  Able to do a full squat however shifts toward the left on descent and rise  GAIT: Comments: WNLs for clinic ambulation TREATMENT DATE:   01/02/24: Pt arrives for aquatic physical therapy. Treatment took place in 3.5-5.5 feet of water. Water temperature was 91 degrees F. Pt entered the pool via stairs independently. Pt requires buoyancy of water for support and to offload joints with strengthening exercises.  Pt utilizes viscosity of the water required for strengthening. 75% depth water walking with UE push/pull using single buoy wts.10x in each direction. Single leg stance Bil  with contralateral multiplanar movements 20x each. Horseback cycling 10-15 min underwater. Blue UE wts for extra buoyancy: vertical cross country ski LE 30 sec 1x then 45 sec 2x. Added jumping jack LE 3x10.with blue UE wts in the T position. Lateral steps downs LTLE 2x10 no UE for balance required.  12/26/23:Pt arrives for aquatic physical therapy. Treatment took place in 3.5-5.5 feet of water. Water temperature was 91 degrees F. Pt entered the pool via stairs independently. Pt requires buoyancy of water for support and to offload joints with strengthening exercises.  Pt utilizes viscosity of the water required for strengthening. 75% depth water walking with UE push/pull using single buoy wts. 10x in each  direction. Single leg stance RT with LLE multiplanar movements. Horseback cycling 7 min underwater. Blue UE wts for extra buoyancy: vertical cross country ski LE 2x10.  PATIENT EDUCATION:  Education details: Educated patient on anatomy and physiology of current symptoms, prognosis, plan of care as well as initial self care strategies to promote recovery Person educated: Patient Education method: Explanation Education comprehension: verbalized understanding  HOME EXERCISE PROGRAM: Access Code: [email or web address] URL: [email or web address] Date: 10/17/2023 Prepared by: Lavinia Sharps Most recent ex's given at the top of the list: used Medbridge access code from prior course of PT Exercises - HIP CLOCKS  - 1 x daily - 7 x weekly - 1 sets - 10 reps - Single Leg Deadlift with Kettlebell  - 1 x daily - 7 x weekly - 1 sets - 10 reps - Side Plank with Clam  - 1 x daily - 7 x weekly - 1 sets - 5 reps - Side Plank on Knees  - 1 x daily - 7 x weekly - 1 sets - 5 reps - Bird Dog  - 1 x daily - 7 x weekly - 1 sets - 10 reps - Goblet Squat with Kettlebell  - 1 x  daily - 7 x weekly - 2 sets - 5 reps - Single-Leg [place] Deadlift With Kettlebell  - 1 x daily - 7 x weekly - 1 sets - 10 reps - Runner's Climb  - 1 x daily - 7 x weekly - 1 sets - 10 reps - 3 hold - Side Stepping with Resistance at Thighs  - 1 x daily - 7 x weekly - 3 sets - 10 reps - Backward Monster Walk with Resistance (BKA)  - 1 x daily - 7 x weekly - 3 sets - 10 reps - Forward Monster Walk with Resistance (BKA)  - 1 x daily - 7 x weekly - 3 sets - 10 reps - Single Leg Lunge with Foot on Bench  - 1 x daily - 7 x weekly - 1 sets - 10 reps - Lateral Lunge  - 1 x daily - 7 x weekly - 1 sets - 10 reps - Modified Thomas Stretch  - 1 x daily - 7 x weekly - 2 sets - 2 reps - 30 hold - Supine Lower Trunk Rotation  - 1 x daily - 7 x weekly - 2 sets - 10 reps - Supine Bridge  - 1 x daily - 7 x weekly - 2 sets - 10 reps - Clamshell with  Resistance  - 1 x daily - 7 x weekly - 2 sets - 10 reps - Supine Dead Bug with Leg Extension  - 1 x daily - 7 x weekly - 2 sets - 10 reps - Abdominal Bracing  - 1 x daily - 7 x weekly - 2 sets - 10 reps - Side Lunge Adductor Stretch  - 1 x daily - 7 x weekly - 2 sets - 2 reps - 30 hold - Standing Hip Flexor Stretch  - 1 x daily - 7 x weekly - 2 sets - 2 reps - 30 hold - Latissimus Dorsi Stretch at Wall  - 1 x daily - 7 x weekly - 2 sets - 2 reps - 30 hold - Pigeon Pose  - 2 x daily - 7 x weekly - 1 sets - 2 reps - 30 hold - Hooklying Isometric Hip Flexion  - 1 x daily - 7 x weekly - 1 sets - 5 reps - 5 hold - Figure 4 Bridge  - 1 x daily - 7 x weekly - 2 sets - 5 reps - Beginner Front Arm Support  - 1 x daily - 7 x weekly - 2 sets - 5 reps - 3 hold - Single Leg Balance with Opposite Leg Star Reach  - 1 x daily - 7 x weekly - 2 sets - 5 reps - Sidelying Hip Abduction  - 1 x daily - 7 x weekly - 1 sets - 10 reps - Quadruped on Forearms Bent Knee Hip Extension  - 1 x daily - 7 x weekly - 1 sets - 10 reps   ASSESSMENT:  CLINICAL IMPRESSION: Pt had muscular soreness only after her aquatic session last week.Hip appears to be tolerating her exercises well. She was able to cycle a small amount on the weekend with a little "weird feel" to the leg/hip but it wasn't pain. She tolerated all new loads today very well.   OBJECTIVE IMPAIRMENTS: decreased activity tolerance, decreased strength, increased fascial restrictions, impaired perceived functional ability, and pain.   ACTIVITY LIMITATIONS: squatting and locomotion level  PARTICIPATION LIMITATIONS: community activity and recreation  PERSONAL FACTORS: Time since onset of injury/illness/exacerbation are also  affecting patient's functional outcome.   REHAB POTENTIAL: Good  CLINICAL DECISION MAKING: Stable/uncomplicated  EVALUATION COMPLEXITY: Low   GOALS: Goals reviewed with patient? Yes  SHORT TERM GOALS: Target date: 11/13/2023    The  patient will demonstrate knowledge of basic self care strategies and exercises to promote healing  Baseline: Goal status: met 2/25 2.  The patient will have improved hip strength to at least 4/5 needed for standing, walking longer distances  Baseline:  Goal status: met 2/25  3.  Able to single leg stand on right 10 sec without pelvic drop Baseline:  Goal status: met 2/25      LONG TERM GOALS: Target date:01/22/2024      The patient will be independent in a safe self progression of a home exercise program to promote further recovery of function  Baseline:  Goal status: ongoing  2.  The patient will have improved hip strength to at least 4+/5 needed for standing, walking longer distances Baseline:  Goal status: ongoing  3.  Able to do a full squat without compensatory shifting Baseline:  Goal status: ongoing  4.  Able to walk 3 miles for exercise/recreation Baseline:  Goal status: ongoing 5.  The patient will have improved FOTO score to   76%    indicating improved function with less pain  Baseline:  Goal status: ongoing    PLAN:  PT FREQUENCY: every other week  PT DURATION: 12 weeks  PLANNED INTERVENTIONS: 97164- PT Re-evaluation, 97110-Therapeutic exercises, 97530- Therapeutic activity, 97112- Neuromuscular re-education, 97535- Self Care, 65784- Manual therapy, [id number]- Aquatic Therapy, 97014- Electrical stimulation (unattended), [id number]- Electrical stimulation (manual), [id number]- Ultrasound, 69629- Ionotophoresis 4mg /ml Dexamethasone, Taping, Dry Needling, Joint mobilization, Moist heat, and Biofeedback  PLAN FOR NEXT SESSION: Continue with hip strength and stabilization.   Ane Payment, PTA 01/02/24 9:15 AM

## 2024-01-04 NOTE — Patient Instructions (Signed)
 Marland Kitchen

## 2024-01-09 ENCOUNTER — Encounter: Payer: Self-pay | Admitting: Physical Therapy

## 2024-01-15 ENCOUNTER — Encounter: Admitting: Physical Therapy

## 2024-01-31 ENCOUNTER — Ambulatory Visit: Attending: Family Medicine | Admitting: Physical Therapy

## 2024-01-31 DIAGNOSIS — M6281 Muscle weakness (generalized): Secondary | ICD-10-CM | POA: Insufficient documentation

## 2024-01-31 DIAGNOSIS — M25651 Stiffness of right hip, not elsewhere classified: Secondary | ICD-10-CM | POA: Insufficient documentation

## 2024-01-31 DIAGNOSIS — M25551 Pain in right hip: Secondary | ICD-10-CM | POA: Insufficient documentation

## 2024-01-31 NOTE — Therapy (Signed)
 OUTPATIENT PHYSICAL THERAPY LOWER EXTREMITY PROGRESS NOTE/RECERTIFICATION   Patient Name: Stephanie Walton MRN: [id number] DOB:[birth year], 53 y.o., female Today's Date: 01/31/2024  END OF SESSION:  PT End of Session - 01/31/24 1617     Visit Number 8    Date for PT Re-Evaluation 03/27/24    Authorization Type [employer]    PT Start Time 1617    PT Stop Time 1700    PT Time Calculation (min) 43 min    Activity Tolerance Patient tolerated treatment well              No past medical history on file. No past surgical history on file. 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 Cycling trp she did 36 miles Fri, 54 miles Sat, 24 miles Sunday= 114 miles total.  Walking 2 miles is still my limit.  Walking in [place]  on the beach was a struggle.  I feel like my arch collapses at the 2 mile mark. No hip popping noted recently.    Needle phobic  EVAL:New imaging shows Anterior superior right labral hip tear.  Symptoms present for years.   2 miles walking is the limit at one time.  Have to be careful with swing dancing.  Have appt with surgeon Dr. Kaaren Ora on the 23rd.   I joined a Engineer, site 30 min TM walking, leg press (feels good),hip abductor/adductor 55#, lats, row; PVC trunk rotation; leg swings;  I haven't done my other home ex's in a while but going to start back Standing abdominal leg lifts will produce popping right hip   PERTINENT HISTORY: History of Right ankle peroneal tendonitis History of disc bulge PAIN:    Are you having pain? no NPRS scale:  1-2/10 Pain location: right buttock lateral Pain orientation: Right  Pain description: intermittent  Aggravating factors: walking 2 miles, popping with certain movements Relieving factors: some of the exercises  PRECAUTIONS: None    WEIGHT BEARING RESTRICTIONS: No  FALLS:  Has patient fallen in last 6 months? No LIVING ENVIRONMENT: Lives with: lives with their spouse Lives in: House/apartment Stairs: Yes: Internal: 16 steps; on right going up and External: 3 steps; on right going up Has following equipment at home: None  OCCUPATION: full time, international travel, standing desk and lab work   PLOF: Independent  PATIENT GOALS: determine if surgery is needed or if PT can manage this problem  NEXT MD VISIT: consult with surgeon Dr. Kaaren Ora later this month  OBJECTIVE:  Note: Objective measures were completed at Evaluation unless otherwise noted.  DIAGNOSTIC FINDINGS: 08/29/23: MR OF THE RIGHT HIP WITHOUT CONTRAST   TECHNIQUE: Multiplanar, multisequence MR imaging was performed. No intravenous contrast was administered.   COMPARISON:  None Available.   FINDINGS: Bones: Unremarkable   Articular cartilage and labrum   Articular cartilage:  Unremarkable   Labrum:  Torn anterior superior labrum, image 17 series 13.   Joint or bursal effusion   Joint effusion:  Absent   Bursae: No regional bursitis.   Muscles and tendons  Muscles and tendons: Mild band of edema anterolaterally in the gluteus medius muscle, images 5 through 12 series 11, potentially from muscle strain.   Other findings   Miscellaneous:   No sciatic notch impingement.   IMPRESSION: 1. Torn right acetabular anterior superior labrum. 2. Mild band of edema anterolaterally in the gluteus medius muscle, potentially from muscle strain.      PATIENT SURVEYS:  FOTO 67% 3/4: 73% 5/8: 65%  COGNITION: Overall cognitive status: Within functional limits  for tasks assessed     PALPATION: Rt  glut min, glut med, piriformis tender to palpation  LOWER EXTREMITY ROM:  full and symmetrical hip ROM although pt is fearful with passive hip internal and external rotation; no pain with full knee to chest hip flexion;  decreased right hip flexor length noted in sidelying  LOWER EXTREMITY MMT:  right hip abduction 4/5;  right hip flexion and extension 4+/5  TRUNK STRENGTH:  Decreased activation of transverse abdominus muscles; abdominals 4/5; decreased activation of lumbar multifidi; trunk extensors 4/5 Decreased stability noted with bird dogs (WB on right knee)   LOWER EXTREMITY SPECIAL TESTS:  Immediate Pelvic drop with SLS on right   FUNCTIONAL TESTS:  Able to do a full squat however shifts toward the left on descent and rise  GAIT: Comments: WNLs for clinic ambulation The Patient-Specific Functional Scale  Initial:  I am going to ask you to identify up to 3 important activities that you are unable to do or are having difficulty with as a result of this problem.  Today are there any activities that you are unable to do or having difficulty with because of this?  (Patient shown scale and patient rated each activity)  Follow up: When you first came in you had difficulty performing these activities.  Today do you still have difficulty?  Patient-Specific activity scoring scheme (Point to one number):  0 1 2 3 4 5 6 7 8 9  10 Unable                                                                                                          Able to perform To perform                                                                                                    activity at the same Activity         Level as before  Injury or problem  Activity           Walking 2 miles                                                                       Initial:       5-6                  2.              running a little for playing frisbee or jogging                    Initial:             0           3.              Be able to lift heavier things/furniture                                  Initial:      5-6                     TREATMENT DATE: 01/31/24  Bike 12 min L2 while during status update including consult with Dr. Kaaren Ora, discussed aquatic PT for return to jogging in a lower impact environment Review of MRI particular glute medius aspect FOTO PSFS as above SLS on right pelvic drop noted Airplane position on right with pelvic rotation noted, difficulty stabilizing Return to Running Criteria (from Groome et al, 2019)  Ability to perform 20-30 repetitions of each of the following without fatigue:  Single calf raise: unable to tolerate on right  (added supported calf raise to HEP) Single leg bridge right (causes HS cramping but OK with isometric hold-- added to HEP) Single leg sit to stand: can do on right from 24inch height with cues to avoid compensatory pelvic rotation) (added to HEP)  Sidelying hip abduction (try against wall to limit hip flexor use) (added to HEP)  Side planks isometric hold 10 sec  on right (added to HEP)      Therapeutic activity: standing, lifting, squatting    01/02/24: Pt arrives for aquatic physical therapy. Treatment took place in 3.5-5.5 feet of water. Water temperature was 91 degrees F. Pt entered the pool via stairs independently. Pt requires buoyancy of water for support and to offload joints with strengthening exercises.  Pt utilizes viscosity of the water required for strengthening. 75% depth water walking with UE push/pull using single buoy wts.10x in each direction. Single leg stance Bil  with contralateral multiplanar movements 20x each. Horseback cycling 10-15 min underwater. Blue UE wts for extra buoyancy: vertical cross country ski LE 30 sec 1x then 45 sec 2x. Added jumping jack LE 3x10.with blue  UE wts in the T position. Lateral steps downs LTLE 2x10 no UE for balance required.  12/26/23:Pt arrives for aquatic physical therapy. Treatment took place in 3.5-5.5 feet of water. Water temperature was 91 degrees F. Pt entered the pool via stairs independently. Pt requires buoyancy of water for support and to offload joints with strengthening exercises.  Pt utilizes viscosity of  the water required for strengthening. 75% depth water walking with UE push/pull using single buoy wts. 10x in each direction. Single leg stance RT with LLE multiplanar movements. Horseback cycling 7 min underwater. Blue UE wts for extra buoyancy: vertical cross country ski LE 2x10.  PATIENT EDUCATION:  Education details: Educated patient on anatomy and physiology of current symptoms, prognosis, plan of care as well as initial self care strategies to promote recovery Person educated: Patient Education method: Explanation Education comprehension: verbalized understanding  HOME EXERCISE PROGRAM: Access Code: [email or web address] URL: [email or web address] Date: 01/31/2024 Prepared by: Darien Eden  Exercises - HIP CLOCKS  - 1 x daily - 7 x weekly - 1 sets - 10 reps - Single Leg Deadlift with Kettlebell  - 1 x daily - 7 x weekly - 1 sets - 10 reps - Side Plank with Clam  - 1 x daily - 7 x weekly - 1 sets - 5 reps - Side Plank on Knees  - 1 x daily - 7 x weekly - 1 sets - 5 reps - Bird Dog  - 1 x daily - 7 x weekly - 1 sets - 10 reps - Goblet Squat with Kettlebell  - 1 x daily - 7 x weekly - 2 sets - 5 reps - Single-Leg [place] Deadlift With Kettlebell  - 1 x daily - 7 x weekly - 1 sets - 10 reps - Runner's Climb  - 1 x daily - 7 x weekly - 1 sets - 10 reps - 3 hold - Side Stepping with Resistance at Thighs  - 1 x daily - 7 x weekly - 3 sets - 10 reps - Backward Monster Walk with Resistance (BKA)  - 1 x daily - 7 x weekly - 3 sets - 10 reps - Forward Monster Walk with Resistance (BKA)  - 1 x daily - 7 x weekly - 3  sets - 10 reps - Single Leg Lunge with Foot on Bench  - 1 x daily - 7 x weekly - 1 sets - 10 reps - Lateral Lunge  - 1 x daily - 7 x weekly - 1 sets - 10 reps - Modified Thomas Stretch  - 1 x daily - 7 x weekly - 2 sets - 2 reps - 30 hold - Supine Lower Trunk Rotation  - 1 x daily - 7 x weekly - 2 sets - 10 reps - Supine Bridge  - 1 x daily - 7 x weekly - 2 sets - 10 reps - Clamshell with Resistance  - 1 x daily - 7 x weekly - 2 sets - 10 reps - Supine Dead Bug with Leg Extension  - 1 x daily - 7 x weekly - 2 sets - 10 reps - Abdominal Bracing  - 1 x daily - 7 x weekly - 2 sets - 10 reps - Side Lunge Adductor Stretch  - 1 x daily - 7 x weekly - 2 sets - 2 reps - 30 hold - Standing Hip Flexor Stretch  - 1 x daily - 7 x weekly - 2 sets - 2 reps - 30 hold - Latissimus Dorsi Stretch at Wall  - 1 x daily - 7 x weekly - 2 sets - 2 reps - 30 hold - Pigeon Pose  - 2 x daily - 7 x weekly - 1 sets - 2 reps - 30 hold - Hooklying Isometric Hip Flexion  - 1 x daily - 7 x weekly - 1 sets - 5 reps - 5  hold - Figure 4 Bridge  - 1 x daily - 7 x weekly - 2 sets - 5 reps - Beginner Front Arm Support  - 1 x daily - 7 x weekly - 2 sets - 5 reps - 3 hold - Single Leg Balance with Opposite Leg Star Reach  - 1 x daily - 7 x weekly - 2 sets - 5 reps - Sidelying Hip Abduction  - 1 x daily - 7 x weekly - 1 sets - 10 reps - Quadruped on Forearms Bent Knee Hip Extension  - 1 x daily - 7 x weekly - 1 sets - 10 reps - Sidelying Hip Abduction  - 1 x daily - 7 x weekly - 10 sets - 10 reps - Side Plank on Knees  - 1 x daily - 7 x weekly - 1 sets - 5 reps - 10 hold - Single Leg Bridge  - 1 x daily - 7 x weekly - 1 sets - 5-10 reps - 5 hold - Single Leg Heel Raise  - 1 x daily - 7 x weekly - 1 sets - 10 reps - Single Leg Squat with Chair Touch  - 1 x daily - 7 x weekly - 1 sets - 10 reps  ASSESSMENT:  CLINICAL IMPRESSION:  Right glute medius weakness contributing to fatigue of this muscle with walking longer distances.   Pelvic drop noted with single leg standing which limits ability to run for recreation or in an emergency situation.  She also avoids lifting heavier things secondary to weakness.  She is progressing with rehab goals but would benefit from  a continuation of skilled PT for a further progression of strengthening and functional mobility.  Will continue to update and promote independence in a HEP needed for a return to the highest functional level possible with ADLs.       OBJECTIVE IMPAIRMENTS: decreased activity tolerance, decreased strength, increased fascial restrictions, impaired perceived functional ability, and pain.   ACTIVITY LIMITATIONS: squatting and locomotion level  PARTICIPATION LIMITATIONS: community activity and recreation  PERSONAL FACTORS: Time since onset of injury/illness/exacerbation are also affecting patient's functional outcome.   REHAB POTENTIAL: Good  CLINICAL DECISION MAKING: Stable/uncomplicated  EVALUATION COMPLEXITY: Low   GOALS: Goals reviewed with patient? Yes  SHORT TERM GOALS: Target date: 11/13/2023    The patient will demonstrate knowledge of basic self care strategies and exercises to promote healing  Baseline: Goal status: met 2/25 2.  The patient will have improved hip strength to at least 4/5 needed for standing, walking longer distances  Baseline:  Goal status: met 2/25  3.  Able to single leg stand on right 10 sec without pelvic drop Baseline:  Goal status: met 2/25      LONG TERM GOALS: Target date:04/24/2024        The patient will be independent in a safe self progression of a home exercise program to promote further recovery of function  Baseline:  Goal status: ongoing  2.  The patient will have improved hip strength to at least 4+/5 needed for standing, walking longer distances Baseline:  Goal status: ongoing  3.  Able to do a full squat without compensatory shifting Baseline:  Goal status: ongoing  4.  Able to walk 3  miles for exercise/recreation Baseline:  Goal status: ongoing 5.  The patient will have improved FOTO score to   76%    indicating improved function with less pain  Baseline:  Goal status: ongoing  6.  PSFS  for walking 2 miles improved to 7  7.  PSFS for running to play frisbee improved to 3  8. PSFS for lifting heavier objects improved to 7    PLAN:  PT FREQUENCY: weekly PT DURATION: 12 weeks  PLANNED INTERVENTIONS: 97164- PT Re-evaluation, 97110-Therapeutic exercises, 97530- Therapeutic activity, 97112- Neuromuscular re-education, 97535- Self Care, 53664- Manual therapy, [id number]- Aquatic Therapy, 97014- Electrical stimulation (unattended), [id number]- Electrical stimulation (manual), [id number]- Ultrasound, 40347- Ionotophoresis 4mg /ml Dexamethasone, Taping, Dry Needling, Joint mobilization, Moist heat, and Biofeedback  PLAN FOR NEXT SESSION: aquatic and land PT focusing on glute medius strengthening; Continue with hip strength and stabilization.   Darien Eden, PT 01/31/24 5:13 PM Phone: [phone] Fax: [phone]

## 2024-02-27 ENCOUNTER — Encounter: Payer: Self-pay | Admitting: Physical Therapy

## 2024-02-27 ENCOUNTER — Ambulatory Visit: Attending: Family Medicine | Admitting: Physical Therapy

## 2024-02-27 DIAGNOSIS — R293 Abnormal posture: Secondary | ICD-10-CM | POA: Insufficient documentation

## 2024-02-27 DIAGNOSIS — R252 Cramp and spasm: Secondary | ICD-10-CM | POA: Insufficient documentation

## 2024-02-27 DIAGNOSIS — M6281 Muscle weakness (generalized): Secondary | ICD-10-CM | POA: Diagnosis not present

## 2024-02-27 DIAGNOSIS — M25651 Stiffness of right hip, not elsewhere classified: Secondary | ICD-10-CM | POA: Insufficient documentation

## 2024-02-27 DIAGNOSIS — M25551 Pain in right hip: Secondary | ICD-10-CM | POA: Insufficient documentation

## 2024-02-27 NOTE — Therapy (Signed)
 OUTPATIENT PHYSICAL THERAPY LOWER EXTREMITY PROGRESS NOTE   Patient Name: FRANCHON KETTERMAN MRN: [id number] DOB:Jan 28, 1971, 53 y.o., female Today's Date: 02/27/2024  END OF SESSION:  PT End of Session - 02/27/24 1055     Visit Number 9    Date for PT Re-Evaluation 04/24/24    Authorization Type [employer]    PT Start Time 1015    PT Stop Time 1100    PT Time Calculation (min) 45 min    Activity Tolerance Patient tolerated treatment well    Behavior During Therapy [facility] for tasks assessed/performed               History reviewed. No pertinent past medical history. History reviewed. No pertinent surgical history. 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  EVAL:New imaging shows Anterior superior right labral hip tear.  Symptoms present for years.   2 miles walking is the limit at one time.  Have to be careful with swing dancing.  Have appt with surgeon Dr. Kaaren Ora on the 23rd.   I joined a Engineer, site 30 min TM walking, leg press (feels good),hip abductor/adductor 55#, lats, row; PVC trunk rotation; leg swings;  I haven't done my other home ex's in a while but going to start back Standing abdominal leg lifts will produce popping right hip   PERTINENT HISTORY: History of Right ankle peroneal tendonitis History of disc bulge PAIN:   Are you having pain? no NPRS scale:  1-2/10 Pain location: right buttock lateral Pain orientation: Right  Pain description:  intermittent  Aggravating factors: walking 2 miles, popping with certain movements Relieving factors: some of the exercises  PRECAUTIONS: None    WEIGHT BEARING RESTRICTIONS: No  FALLS:  Has patient fallen in last 6 months? No LIVING ENVIRONMENT: Lives with: lives with their spouse Lives in: House/apartment Stairs: Yes: Internal: 16 steps; on right going up and External: 3 steps; on right going up Has following equipment at home: None  OCCUPATION: full time, international travel, standing desk and lab work   PLOF: Independent  PATIENT GOALS: determine if surgery is needed or if PT can manage this problem  NEXT MD VISIT: consult with surgeon Dr. Kaaren Ora later this month  OBJECTIVE:  Note: Objective measures were completed at Evaluation unless otherwise noted.  DIAGNOSTIC FINDINGS: 08/29/23: MR OF THE RIGHT HIP WITHOUT CONTRAST   TECHNIQUE: Multiplanar, multisequence MR imaging was performed. No intravenous contrast was administered.   COMPARISON:  None Available.   FINDINGS: Bones: Unremarkable   Articular cartilage and labrum   Articular cartilage:  Unremarkable   Labrum:  Torn anterior superior labrum, image 17 series 13.   Joint or bursal effusion   Joint effusion:  Absent   Bursae: No regional bursitis.   Muscles and tendons   Muscles and tendons: Mild band of edema anterolaterally in the gluteus medius muscle, images 5 through 12 series 11, potentially from muscle strain.   Other findings   Miscellaneous:   No sciatic notch  impingement.   IMPRESSION: 1. Torn right acetabular anterior superior labrum. 2. Mild band of edema anterolaterally in the gluteus medius muscle, potentially from muscle strain.      PATIENT SURVEYS:  FOTO 67% 3/4: 73% 5/8: 65%  COGNITION: Overall cognitive status: Within functional limits for tasks assessed     PALPATION: Rt  glut min, glut med, piriformis tender to palpation  LOWER EXTREMITY ROM:  full and  symmetrical hip ROM although pt is fearful with passive hip internal and external rotation; no pain with full knee to chest hip flexion;  decreased right hip flexor length noted in sidelying  LOWER EXTREMITY MMT:  right hip abduction 4/5;  right hip flexion and extension 4+/5  TRUNK STRENGTH:  Decreased activation of transverse abdominus muscles; abdominals 4/5; decreased activation of lumbar multifidi; trunk extensors 4/5 Decreased stability noted with bird dogs (WB on right knee)   LOWER EXTREMITY SPECIAL TESTS:  Immediate Pelvic drop with SLS on right   FUNCTIONAL TESTS:  Able to do a full squat however shifts toward the left on descent and rise  GAIT: Comments: WNLs for clinic ambulation The Patient-Specific Functional Scale  Initial:  I am going to ask you to identify up to 3 important activities that you are unable to do or are having difficulty with as a result of this problem.  Today are there any activities that you are unable to do or having difficulty with because of this?  (Patient shown scale and patient rated each activity)  Follow up: When you first came in you had difficulty performing these activities.  Today do you still have difficulty?  Patient-Specific activity scoring scheme (Point to one number):  0 1 2 3 4 5 6 7 8 9  10 Unable                                                                                                          Able to perform To perform                                                                                                    activity at the same Activity         Level as before  Injury or problem  Activity           Walking 2 miles                                                                      Initial:       5-6                  2.              running a little for playing frisbee or jogging                     Initial:             0           3.              Be able to lift heavier things/furniture                                  Initial:      5-6                     TREATMENT DATE: 02/27/24:Pt arrives for aquatic physical therapy. Treatment took place in 3.5-5.5 feet of water. Water temperature was 91 degrees F. Pt entered the pool via stairs independently. Pt requires buoyancy of water for support and to offload joints with strengthening exercises.  Pt utilizes viscosity of the water required for strengthening. 75% depth water walking with UE push/pull using single buoy wts.10x in each direction. Hip 3 ways with added ankle fins 15x in each direction. Discussion and verbal education on how to practice at home/on land better quad activation. Pt verbally understood all ideas. 5 min underwater bicycle with ankle fins on to increase load for push/pull on horseback.     01/31/24  Bike 12 min L2 while during status update including consult with Dr. Kaaren Ora, discussed aquatic PT for return to jogging in a lower impact environment Review of MRI particular glute medius aspect FOTO PSFS as above SLS on right pelvic drop noted Airplane position on right with pelvic rotation noted, difficulty stabilizing Return to Running Criteria (from Groome et al, 2019)  Ability to perform 20-30 repetitions of each of the following without fatigue:  Single calf raise: unable to tolerate on right  (added supported calf raise to HEP) Single leg bridge right (causes HS cramping but OK with isometric hold-- added to HEP) Single leg sit to stand: can do on right from 24inch height with cues to avoid compensatory pelvic rotation) (added to HEP)  Sidelying hip abduction (try against wall to limit hip flexor use) (added to HEP)  Side planks isometric hold 10 sec  on right (added to HEP)      Therapeutic activity: standing, lifting, squatting    01/02/24: Pt arrives for aquatic physical therapy. Treatment took place in 3.5-5.5 feet  of water. Water temperature was 91 degrees F. Pt entered the pool via stairs independently. Pt requires buoyancy of water for support and to offload joints with strengthening exercises.  Pt utilizes viscosity of the water required for strengthening. 75% depth water  walking with UE push/pull using single buoy wts.10x in each direction. Single leg stance Bil  with contralateral multiplanar movements 20x each. Horseback cycling 10-15 min underwater. Blue UE wts for extra buoyancy: vertical cross country ski LE 30 sec 1x then 45 sec 2x. Added jumping jack LE 3x10.with blue UE wts in the T position. Lateral steps downs LTLE 2x10 no UE for balance required.  PATIENT EDUCATION:  Education details: Educated patient on anatomy and physiology of current symptoms, prognosis, plan of care as well as initial self care strategies to promote recovery Person educated: Patient Education method: Explanation Education comprehension: verbalized understanding  HOME EXERCISE PROGRAM: Access Code: [email or web address] URL: [email or web address] Date: 01/31/2024 Prepared by: Darien Eden  Exercises - HIP CLOCKS  - 1 x daily - 7 x weekly - 1 sets - 10 reps - Single Leg Deadlift with Kettlebell  - 1 x daily - 7 x weekly - 1 sets - 10 reps - Side Plank with Clam  - 1 x daily - 7 x weekly - 1 sets - 5 reps - Side Plank on Knees  - 1 x daily - 7 x weekly - 1 sets - 5 reps - Bird Dog  - 1 x daily - 7 x weekly - 1 sets - 10 reps - Goblet Squat with Kettlebell  - 1 x daily - 7 x weekly - 2 sets - 5 reps - Single-Leg [place] Deadlift With Kettlebell  - 1 x daily - 7 x weekly - 1 sets - 10 reps - Runner's Climb  - 1 x daily - 7 x weekly - 1 sets - 10 reps - 3 hold - Side Stepping with Resistance at Thighs  - 1 x daily - 7 x weekly - 3 sets - 10 reps - Backward Monster Walk with Resistance (BKA)  - 1 x daily - 7 x weekly - 3 sets - 10 reps - Forward Monster Walk with Resistance (BKA)  - 1 x daily - 7 x weekly - 3 sets -  10 reps - Single Leg Lunge with Foot on Bench  - 1 x daily - 7 x weekly - 1 sets - 10 reps - Lateral Lunge  - 1 x daily - 7 x weekly - 1 sets - 10 reps - Modified Thomas Stretch  - 1 x daily - 7 x weekly - 2 sets - 2 reps - 30 hold - Supine Lower Trunk Rotation  - 1 x daily - 7 x weekly - 2 sets - 10 reps - Supine Bridge  - 1 x daily - 7 x weekly - 2 sets - 10 reps - Clamshell with Resistance  - 1 x daily - 7 x weekly - 2 sets - 10 reps - Supine Dead Bug with Leg Extension  - 1 x daily - 7 x weekly - 2 sets - 10 reps - Abdominal Bracing  - 1 x daily - 7 x weekly - 2 sets - 10 reps - Side Lunge Adductor Stretch  - 1 x daily - 7 x weekly - 2 sets - 2 reps - 30 hold - Standing Hip Flexor Stretch  - 1 x daily - 7 x weekly - 2 sets - 2 reps - 30 hold - Latissimus Dorsi Stretch at Wall  - 1 x daily - 7 x weekly - 2 sets - 2 reps - 30 hold - Pigeon Pose  - 2 x daily - 7 x weekly - 1 sets - 2 reps - 30 hold -  Hooklying Isometric Hip Flexion  - 1 x daily - 7 x weekly - 1 sets - 5 reps - 5 hold - Figure 4 Bridge  - 1 x daily - 7 x weekly - 2 sets - 5 reps - Beginner Front Arm Support  - 1 x daily - 7 x weekly - 2 sets - 5 reps - 3 hold - Single Leg Balance with Opposite Leg Star Reach  - 1 x daily - 7 x weekly - 2 sets - 5 reps - Sidelying Hip Abduction  - 1 x daily - 7 x weekly - 1 sets - 10 reps - Quadruped on Forearms Bent Knee Hip Extension  - 1 x daily - 7 x weekly - 1 sets - 10 reps - Sidelying Hip Abduction  - 1 x daily - 7 x weekly - 10 sets - 10 reps - Side Plank on Knees  - 1 x daily - 7 x weekly - 1 sets - 5 reps - 10 hold - Single Leg Bridge  - 1 x daily - 7 x weekly - 1 sets - 5-10 reps - 5 hold - Single Leg Heel Raise  - 1 x daily - 7 x weekly - 1 sets - 10 reps - Single Leg Squat with Chair Touch  - 1 x daily - 7 x weekly - 1 sets - 10 reps  ASSESSMENT:  CLINICAL IMPRESSION: Pt overall doing fantastic. We progressed her aquatic exercises to increase load for hip and quad strength  while also discussing other things she can do on land when she cannot get to the pool.    OBJECTIVE IMPAIRMENTS: decreased activity tolerance, decreased strength, increased fascial restrictions, impaired perceived functional ability, and pain.   ACTIVITY LIMITATIONS: squatting and locomotion level  PARTICIPATION LIMITATIONS: community activity and recreation  PERSONAL FACTORS: Time since onset of injury/illness/exacerbation are also affecting patient's functional outcome.   REHAB POTENTIAL: Good  CLINICAL DECISION MAKING: Stable/uncomplicated  EVALUATION COMPLEXITY: Low   GOALS: Goals reviewed with patient? Yes  SHORT TERM GOALS: Target date: 11/13/2023    The patient will demonstrate knowledge of basic self care strategies and exercises to promote healing  Baseline: Goal status: met 2/25 2.  The patient will have improved hip strength to at least 4/5 needed for standing, walking longer distances  Baseline:  Goal status: met 2/25  3.  Able to single leg stand on right 10 sec without pelvic drop Baseline:  Goal status: met 2/25      LONG TERM GOALS: Target date:04/24/2024        The patient will be independent in a safe self progression of a home exercise program to promote further recovery of function  Baseline:  Goal status: ongoing  2.  The patient will have improved hip strength to at least 4+/5 needed for standing, walking longer distances Baseline:  Goal status: ongoing  3.  Able to do a full squat without compensatory shifting Baseline:  Goal status: ongoing  4.  Able to walk 3 miles for exercise/recreation Baseline:  Goal status: ongoing 5.  The patient will have improved FOTO score to   76%    indicating improved function with less pain  Baseline:  Goal status: ongoing  6.  PSFS for walking 2 miles improved to 7  7.  PSFS for running to play frisbee improved to 3  8. PSFS for lifting heavier objects improved to 7    PLAN:  PT  FREQUENCY: weekly PT DURATION: 12 weeks  PLANNED INTERVENTIONS: 97164- PT Re-evaluation, 97110-Therapeutic exercises, 97530- Therapeutic activity, [id number]- Neuromuscular re-education, 97535- Self Care, 62952- Manual therapy, [id number]- Aquatic Therapy, 97014- Electrical stimulation (unattended), [id number]- Electrical stimulation (manual), [id number]- Ultrasound, [id number]- Ionotophoresis 4mg /ml Dexamethasone, Taping, Dry Needling, Joint mobilization, Moist heat, and Biofeedback  PLAN FOR NEXT SESSION: aquatic and land PT focusing on glute medius strengthening; Continue with hip strength and stabilization.   Bethanne Brooks, PTA 02/27/24 10:56 AM

## 2024-03-06 ENCOUNTER — Ambulatory Visit: Admitting: Physical Therapy

## 2024-03-06 DIAGNOSIS — M6281 Muscle weakness (generalized): Secondary | ICD-10-CM

## 2024-03-06 DIAGNOSIS — M25551 Pain in right hip: Secondary | ICD-10-CM

## 2024-03-06 DIAGNOSIS — M25651 Stiffness of right hip, not elsewhere classified: Secondary | ICD-10-CM

## 2024-03-06 NOTE — Therapy (Signed)
 OUTPATIENT PHYSICAL THERAPY LOWER EXTREMITY PROGRESS NOTE   Patient Name: Stephanie Walton MRN: [id number] DOB:04-12-1971, 53 y.o., female Today's Date: 03/06/2024  END OF SESSION:  PT End of Session - 03/06/24 0801     Visit Number 10    Date for PT Re-Evaluation 04/24/24    Authorization Type [employer]    PT Start Time 0802    PT Stop Time 0843    PT Time Calculation (min) 41 min    Activity Tolerance Patient tolerated treatment well            No past medical history on file. No past surgical history on file. 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  The slope of the beach made it easier one way.  One day did a total of 4 miles.  It was a good sore afterwards.  The right quad is not as strong, doesn't fire as quickly as the left.  I've been focusing on firing that.  My massage therapist worked out a huge knot in that quad.      EVAL:New imaging shows Anterior superior right labral hip tear.  Symptoms present for years.   2 miles walking is the limit at one time.  Have to be careful with swing dancing.  Have appt with surgeon Dr. Kaaren Ora on the 23rd.   I joined a Engineer, site 30 min TM walking, leg press (feels good),hip abductor/adductor 55#, lats, row; PVC trunk rotation; leg swings;  I haven't done my other home ex's in a while but going to start back Standing abdominal leg lifts will produce popping right hip   PERTINENT HISTORY: History of Right ankle peroneal tendonitis History of disc bulge PAIN:   Are you  having pain? no NPRS scale:  0/10 Pain location: right buttock lateral Pain orientation: Right  Pain description: intermittent  Aggravating factors: walking 2 miles, popping with certain movements Relieving factors: some of the exercises  PRECAUTIONS: None    WEIGHT BEARING RESTRICTIONS: No  FALLS:  Has patient fallen in last 6 months? No LIVING ENVIRONMENT: Lives with: lives with their spouse Lives in: House/apartment Stairs: Yes: Internal: 16 steps; on right going up and External: 3 steps; on right going up Has following equipment at home: None  OCCUPATION: full time, international travel, standing desk and lab work   PLOF: Independent  PATIENT GOALS: determine if surgery is needed or if PT can manage this problem  NEXT MD VISIT: consult with surgeon Dr. Kaaren Ora later this month  OBJECTIVE:  Note: Objective measures were completed at Evaluation unless otherwise noted.  DIAGNOSTIC FINDINGS: 08/29/23: MR OF THE RIGHT HIP WITHOUT CONTRAST   TECHNIQUE: Multiplanar, multisequence MR imaging was performed. No intravenous contrast was administered.   COMPARISON:  None Available.   FINDINGS: Bones: Unremarkable   Articular cartilage and labrum   Articular cartilage:  Unremarkable   Labrum:  Torn anterior superior labrum, image 17 series 13.   Joint or bursal effusion   Joint effusion:  Absent   Bursae: No regional bursitis.  Muscles and tendons   Muscles and tendons: Mild band of edema anterolaterally in the gluteus medius muscle, images 5 through 12 series 11, potentially from muscle strain.   Other findings   Miscellaneous:   No sciatic notch impingement.   IMPRESSION: 1. Torn right acetabular anterior superior labrum. 2. Mild band of edema anterolaterally in the gluteus medius muscle, potentially from muscle strain.      PATIENT SURVEYS:  FOTO 67% 3/4: 73% 5/8: 65%  COGNITION: Overall cognitive status: Within functional limits for tasks  assessed     PALPATION: Rt  glut min, glut med, piriformis tender to palpation  LOWER EXTREMITY ROM:  full and symmetrical hip ROM although pt is fearful with passive hip internal and external rotation; no pain with full knee to chest hip flexion;  decreased right hip flexor length noted in sidelying  LOWER EXTREMITY MMT:  right hip abduction 4/5;  right hip flexion and extension 4+/5  TRUNK STRENGTH:  Decreased activation of transverse abdominus muscles; abdominals 4/5; decreased activation of lumbar multifidi; trunk extensors 4/5 Decreased stability noted with bird dogs (WB on right knee)   LOWER EXTREMITY SPECIAL TESTS:  Immediate Pelvic drop with SLS on right   FUNCTIONAL TESTS:  Able to do a full squat however shifts toward the left on descent and rise  GAIT: Comments: WNLs for clinic ambulation The Patient-Specific Functional Scale  Initial:  I am going to ask you to identify up to 3 important activities that you are unable to do or are having difficulty with as a result of this problem.  Today are there any activities that you are unable to do or having difficulty with because of this?  (Patient shown scale and patient rated each activity)  Follow up: When you first came in you had difficulty performing these activities.  Today do you still have difficulty?  Patient-Specific activity scoring scheme (Point to one number):  0 1 2 3 4 5 6 7 8 9  10 Unable                                                                                                          Able to perform To perform                                                                                                    activity at the same Activity         Level as before  Injury or problem  Activity           Walking 2 miles                                                                       Initial:       5-6                  2.              running a little for playing frisbee or jogging                    Initial:             0           3.              Be able to lift heavier things/furniture                                  Initial:      5-6                     TREATMENT DATE: 03/06/24  Bike 10 min L2 while during status update  Staggered stance right leg back STS from 24 inch 10x Spanish squats with purple power cord around right knee 10x 30# barbell deadlifts from 24 inch height 12x Discussed toe spacers for aid in great toe alignment Resisted backward walking 10# cable pulley with emphasis on push off/toe lift 20x Static isometric heel raise with weight shift with finger touch for 45 sec Heel raises and lowers in box formation finger touch for balance 5x each way Ascending and descending steps heel comes off early Calf wall stretch 20 sec  right      Review of MRI particular glute medius aspect FOTO PSFS as above SLS on right pelvic drop noted Airplane position on right with pelvic rotation noted, difficulty stabilizing Return to Running Criteria (from Groome et al, 2019)  Ability to perform 20-30 repetitions of each of the following without fatigue:  Single calf raise: unable to tolerate on right  (added supported calf raise to HEP) Single leg bridge right (causes HS cramping but OK with isometric hold-- added to HEP) Single leg sit to stand: can do on right from 24inch height with cues to avoid compensatory pelvic rotation) (added to HEP)  Sidelying hip abduction (try against wall to limit hip flexor use) (added to HEP)  Side planks isometric hold 10 sec  on right (added to HEP)      02/27/24:Pt arrives for aquatic physical therapy. Treatment took place in 3.5-5.5 feet of water. Water temperature was 91 degrees F. Pt entered the pool via stairs independently. Pt requires buoyancy of water for support and to offload joints with strengthening exercises.  Pt  utilizes viscosity of the water required for strengthening. 75% depth water walking with UE push/pull using single buoy wts.10x in each direction. Hip 3 ways with added ankle fins 15x in each direction. Discussion and verbal education on how to practice at home/on land better quad activation. Pt verbally understood all ideas. 5  min underwater bicycle with ankle fins on to increase load for push/pull on horseback.     01/31/24  Bike 12 min L2 while during status update including consult with Dr. Kaaren Ora, discussed aquatic PT for return to jogging in a lower impact environment Review of MRI particular glute medius aspect FOTO PSFS as above SLS on right pelvic drop noted Airplane position on right with pelvic rotation noted, difficulty stabilizing Return to Running Criteria (from Groome et al, 2019)  Ability to perform 20-30 repetitions of each of the following without fatigue:  Single calf raise: unable to tolerate on right  (added supported calf raise to HEP) Single leg bridge right (causes HS cramping but OK with isometric hold-- added to HEP) Single leg sit to stand: can do on right from 24inch height with cues to avoid compensatory pelvic rotation) (added to HEP)  Sidelying hip abduction (try against wall to limit hip flexor use) (added to HEP)  Side planks isometric hold 10 sec  on right (added to HEP)      Therapeutic activity: standing, lifting, squatting    01/02/24: Pt arrives for aquatic physical therapy. Treatment took place in 3.5-5.5 feet of water. Water temperature was 91 degrees F. Pt entered the pool via stairs independently. Pt requires buoyancy of water for support and to offload joints with strengthening exercises.  Pt utilizes viscosity of the water required for strengthening. 75% depth water walking with UE push/pull using single buoy wts.10x in each direction. Single leg stance Bil  with contralateral multiplanar movements 20x each. Horseback cycling 10-15 min underwater. Blue UE  wts for extra buoyancy: vertical cross country ski LE 30 sec 1x then 45 sec 2x. Added jumping jack LE 3x10.with blue UE wts in the T position. Lateral steps downs LTLE 2x10 no UE for balance required.  PATIENT EDUCATION:  Education details: Educated patient on anatomy and physiology of current symptoms, prognosis, plan of care as well as initial self care strategies to promote recovery Person educated: Patient Education method: Explanation Education comprehension: verbalized understanding  HOME EXERCISE PROGRAM: Access Code: [email or web address] URL: [email or web address] Date: 01/31/2024 Prepared by: Darien Eden  Exercises - HIP CLOCKS  - 1 x daily - 7 x weekly - 1 sets - 10 reps - Single Leg Deadlift with Kettlebell  - 1 x daily - 7 x weekly - 1 sets - 10 reps - Side Plank with Clam  - 1 x daily - 7 x weekly - 1 sets - 5 reps - Side Plank on Knees  - 1 x daily - 7 x weekly - 1 sets - 5 reps - Bird Dog  - 1 x daily - 7 x weekly - 1 sets - 10 reps - Goblet Squat with Kettlebell  - 1 x daily - 7 x weekly - 2 sets - 5 reps - Single-Leg [place] Deadlift With Kettlebell  - 1 x daily - 7 x weekly - 1 sets - 10 reps - Runner's Climb  - 1 x daily - 7 x weekly - 1 sets - 10 reps - 3 hold - Side Stepping with Resistance at Thighs  - 1 x daily - 7 x weekly - 3 sets - 10 reps - Backward Monster Walk with Resistance (BKA)  - 1 x daily - 7 x weekly - 3 sets - 10 reps - Forward Monster Walk with Resistance (BKA)  - 1 x daily - 7 x weekly - 3 sets - 10 reps - Single Leg Lunge with Foot on Bench  -  1 x daily - 7 x weekly - 1 sets - 10 reps - Lateral Lunge  - 1 x daily - 7 x weekly - 1 sets - 10 reps - Modified Thomas Stretch  - 1 x daily - 7 x weekly - 2 sets - 2 reps - 30 hold - Supine Lower Trunk Rotation  - 1 x daily - 7 x weekly - 2 sets - 10 reps - Supine Bridge  - 1 x daily - 7 x weekly - 2 sets - 10 reps - Clamshell with Resistance  - 1 x daily - 7 x weekly - 2 sets - 10 reps - Supine  Dead Bug with Leg Extension  - 1 x daily - 7 x weekly - 2 sets - 10 reps - Abdominal Bracing  - 1 x daily - 7 x weekly - 2 sets - 10 reps - Side Lunge Adductor Stretch  - 1 x daily - 7 x weekly - 2 sets - 2 reps - 30 hold - Standing Hip Flexor Stretch  - 1 x daily - 7 x weekly - 2 sets - 2 reps - 30 hold - Latissimus Dorsi Stretch at Wall  - 1 x daily - 7 x weekly - 2 sets - 2 reps - 30 hold - Pigeon Pose  - 2 x daily - 7 x weekly - 1 sets - 2 reps - 30 hold - Hooklying Isometric Hip Flexion  - 1 x daily - 7 x weekly - 1 sets - 5 reps - 5 hold - Figure 4 Bridge  - 1 x daily - 7 x weekly - 2 sets - 5 reps - Beginner Front Arm Support  - 1 x daily - 7 x weekly - 2 sets - 5 reps - 3 hold - Single Leg Balance with Opposite Leg Star Reach  - 1 x daily - 7 x weekly - 2 sets - 5 reps - Sidelying Hip Abduction  - 1 x daily - 7 x weekly - 1 sets - 10 reps - Quadruped on Forearms Bent Knee Hip Extension  - 1 x daily - 7 x weekly - 1 sets - 10 reps - Sidelying Hip Abduction  - 1 x daily - 7 x weekly - 10 sets - 10 reps - Side Plank on Knees  - 1 x daily - 7 x weekly - 1 sets - 5 reps - 10 hold - Single Leg Bridge  - 1 x daily - 7 x weekly - 1 sets - 5-10 reps - 5 hold - Single Leg Heel Raise  - 1 x daily - 7 x weekly - 1 sets - 10 reps - Single Leg Squat with Chair Touch  - 1 x daily - 7 x weekly - 1 sets - 10 reps  ASSESSMENT:  CLINICAL IMPRESSION:  Treatment focus on right quad activation in weight bearing as well as eccentric motor control of the right ankle. Instructed in lengthening the gastroc to help with descending stairs.  Therapist providing verbal cues to optimize technique and to monitor response.  No pain reported in hip today.      OBJECTIVE IMPAIRMENTS: decreased activity tolerance, decreased strength, increased fascial restrictions, impaired perceived functional ability, and pain.   ACTIVITY LIMITATIONS: squatting and locomotion level  PARTICIPATION LIMITATIONS: community activity  and recreation  PERSONAL FACTORS: Time since onset of injury/illness/exacerbation are also affecting patient's functional outcome.   REHAB POTENTIAL: Good  CLINICAL DECISION MAKING: Stable/uncomplicated  EVALUATION COMPLEXITY: Low  GOALS: Goals reviewed with patient? Yes  SHORT TERM GOALS: Target date: 11/13/2023    The patient will demonstrate knowledge of basic self care strategies and exercises to promote healing  Baseline: Goal status: met 2/25 2.  The patient will have improved hip strength to at least 4/5 needed for standing, walking longer distances  Baseline:  Goal status: met 2/25  3.  Able to single leg stand on right 10 sec without pelvic drop Baseline:  Goal status: met 2/25      LONG TERM GOALS: Target date:04/24/2024        The patient will be independent in a safe self progression of a home exercise program to promote further recovery of function  Baseline:  Goal status: ongoing  2.  The patient will have improved hip strength to at least 4+/5 needed for standing, walking longer distances Baseline:  Goal status: ongoing  3.  Able to do a full squat without compensatory shifting Baseline:  Goal status: ongoing  4.  Able to walk 3 miles for exercise/recreation Baseline:  Goal status: ongoing 5.  The patient will have improved FOTO score to   76%    indicating improved function with less pain  Baseline:  Goal status: ongoing  6.  PSFS for walking 2 miles improved to 7  7.  PSFS for running to play frisbee improved to 3  8. PSFS for lifting heavier objects improved to 7    PLAN:  PT FREQUENCY: weekly PT DURATION: 12 weeks  PLANNED INTERVENTIONS: 97164- PT Re-evaluation, 97110-Therapeutic exercises, 97530- Therapeutic activity, 97112- Neuromuscular re-education, 97535- Self Care, 45409- Manual therapy, [id number]- Aquatic Therapy, 97014- Electrical stimulation (unattended), [id number]- Electrical stimulation (manual), [id number]- Ultrasound, [id number]-  Ionotophoresis 4mg /ml Dexamethasone, Taping, Dry Needling, Joint mobilization, Moist heat, and Biofeedback  PLAN FOR NEXT SESSION: aquatic and land PT focusing on right glute medius, quad, ankle motor control  Darien Eden, PT 03/06/24 6:51 PM Phone: [phone] Fax: [phone]

## 2024-03-19 ENCOUNTER — Encounter: Payer: Self-pay | Admitting: Physical Therapy

## 2024-03-19 ENCOUNTER — Ambulatory Visit: Admitting: Physical Therapy

## 2024-03-19 DIAGNOSIS — R293 Abnormal posture: Secondary | ICD-10-CM | POA: Diagnosis not present

## 2024-03-19 DIAGNOSIS — M25651 Stiffness of right hip, not elsewhere classified: Secondary | ICD-10-CM

## 2024-03-19 DIAGNOSIS — M6281 Muscle weakness (generalized): Secondary | ICD-10-CM | POA: Diagnosis not present

## 2024-03-19 DIAGNOSIS — M25551 Pain in right hip: Secondary | ICD-10-CM

## 2024-03-19 DIAGNOSIS — R252 Cramp and spasm: Secondary | ICD-10-CM | POA: Diagnosis not present

## 2024-03-19 NOTE — Therapy (Signed)
 OUTPATIENT PHYSICAL THERAPY LOWER EXTREMITY PROGRESS NOTE   Patient Name: Stephanie Walton MRN: [id number] DOB:[birth year], 53 y.o., female Today's Date: 03/19/2024  END OF SESSION:  PT End of Session - 03/19/24 0959     Visit Number 11    Date for PT Re-Evaluation 04/24/24    Authorization Type [employer]    PT Start Time 0930    PT Stop Time 1015    PT Time Calculation (min) 45 min    Activity Tolerance Patient tolerated treatment well    Behavior During Therapy [facility] for tasks assessed/performed            History reviewed. No pertinent past medical history. History reviewed. No pertinent surgical history. 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 No hip pain.    EVAL:New imaging shows Anterior superior right labral hip tear.  Symptoms present for years.   2 miles walking is the limit at one time.  Have to be careful with swing dancing.  Have appt with surgeon Stephanie Walton on the 23rd.   I joined a Engineer, site 30 min TM walking, leg press (feels good),hip abductor/adductor 55#, lats, row; PVC trunk rotation; leg swings;  I haven't done my other home ex's in a while but going to start back Standing abdominal leg lifts will produce popping right hip   PERTINENT HISTORY: History of Right ankle peroneal tendonitis History of disc bulge PAIN:   Are you having pain? no NPRS scale:  0/10 Pain location: right buttock lateral Pain orientation: Right  Pain description: intermittent  Aggravating factors: walking 2 miles, popping with certain movements Relieving  factors: some of the exercises  PRECAUTIONS: None    WEIGHT BEARING RESTRICTIONS: No  FALLS:  Has patient fallen in last 6 months? No LIVING ENVIRONMENT: Lives with: lives with their spouse Lives in: House/apartment Stairs: Yes: Internal: 16 steps; on right going up and External: 3 steps; on right going up Has following equipment at home: None  OCCUPATION: full time, international travel, standing desk and lab work   PLOF: Independent  PATIENT GOALS: determine if surgery is needed or if PT can manage this problem  NEXT Walton VISIT: consult with surgeon Stephanie Walton later this month  OBJECTIVE:  Note: Objective measures were completed at Evaluation unless otherwise noted.  DIAGNOSTIC FINDINGS: 08/29/23: MR OF THE RIGHT HIP WITHOUT CONTRAST   TECHNIQUE: Multiplanar, multisequence MR imaging was performed. No intravenous contrast was administered.   COMPARISON:  None Available.   FINDINGS: Bones: Unremarkable   Articular cartilage and labrum   Articular cartilage:  Unremarkable   Labrum:  Torn anterior superior labrum, image 17 series 13.   Joint or bursal effusion   Joint effusion:  Absent   Bursae: No regional bursitis.   Muscles and tendons   Muscles and tendons: Mild band of edema anterolaterally in the gluteus medius muscle, images 5 through 12 series 11, potentially from muscle strain.   Other findings   Miscellaneous:   No sciatic notch impingement.   IMPRESSION: 1. Torn right acetabular anterior superior labrum. 2. Mild band of edema anterolaterally  in the gluteus medius muscle, potentially from muscle strain.      PATIENT SURVEYS:  FOTO 67% 3/4: 73% 5/8: 65%  COGNITION: Overall cognitive status: Within functional limits for tasks assessed     PALPATION: Rt  glut min, glut med, piriformis tender to palpation  LOWER EXTREMITY ROM:  full and symmetrical hip ROM although pt is fearful with passive hip internal and external rotation; no pain  with full knee to chest hip flexion;  decreased right hip flexor length noted in sidelying  LOWER EXTREMITY MMT:  right hip abduction 4/5;  right hip flexion and extension 4+/5  TRUNK STRENGTH:  Decreased activation of transverse abdominus muscles; abdominals 4/5; decreased activation of lumbar multifidi; trunk extensors 4/5 Decreased stability noted with bird dogs (WB on right knee)   LOWER EXTREMITY SPECIAL TESTS:  Immediate Pelvic drop with SLS on right   FUNCTIONAL TESTS:  Able to do a full squat however shifts toward the left on descent and rise  GAIT: Comments: WNLs for clinic ambulation The Patient-Specific Functional Scale  Initial:  I am going to ask you to identify up to 3 important activities that you are unable to do or are having difficulty with as a result of this problem.  Today are there any activities that you are unable to do or having difficulty with because of this?  (Patient shown scale and patient rated each activity)  Follow up: When you first came in you had difficulty performing these activities.  Today do you still have difficulty?  Patient-Specific activity scoring scheme (Point to one number):  0 1 2 3 4 5 6 7 8 9  10 Unable                                                                                                          Able to perform To perform                                                                                                    activity at the same Activity         Level as before  Injury or problem  Activity           Walking 2 miles                                                                      Initial:       5-6                  2.              running a little for playing frisbee or jogging                    Initial:             0           3.              Be able to lift heavier things/furniture                                   Initial:      5-6                     TREATMENT DATE:  03/19/24:Pt arrives for aquatic physical therapy. Treatment took place in 3.5-5.5 feet of water. Water temperature was 91 degrees F. Pt entered the pool via stairs independently. Pt requires buoyancy of water for support and to offload joints with strengthening exercises.  Pt utilizes viscosity of the water required for strengthening. 75% depth water walking with UE push/pull using single buoy wts.10x in each direction with some lengths being more of a jog/bounding. Hip 3 ways with added ankle fins 15x in each direction. Standing core contractions with pink water bells 2x10 push forward and back. 10  min underwater bicycle with ankle fins on to increase load for push/pull on horseback.  03/06/24  Bike 10 min L2 while during status update  Staggered stance right leg back STS from 24 inch 10x Spanish squats with purple power cord around right knee 10x 30# barbell deadlifts from 24 inch height 12x Discussed toe spacers for aid in great toe alignment Resisted backward walking 10# cable pulley with emphasis on push off/toe lift 20x Static isometric heel raise with weight shift with finger touch for 45 sec Heel raises and lowers in box formation finger touch for balance 5x each way Ascending and descending steps heel comes off early Calf wall stretch 20 sec  right      Review of MRI particular glute medius aspect FOTO PSFS as above SLS on right pelvic drop noted Airplane position on right with pelvic rotation noted, difficulty stabilizing Return to Running Criteria (from Groome et al, 2019)  Ability to perform 20-30 repetitions of each of the following without fatigue:  Single calf raise: unable to tolerate on right  (added supported calf raise to HEP) Single leg bridge right (causes HS cramping but OK with isometric hold-- added to HEP) Single leg sit to stand: can do on right from 24inch height with cues to  avoid compensatory pelvic rotation) (added to HEP)  Sidelying hip abduction (try against wall to limit hip flexor use) (added to HEP)  Side planks isometric hold 10 sec  on right (added to HEP)      PATIENT EDUCATION:  Education details: Educated patient on anatomy and physiology of current symptoms, prognosis, plan of care as well as initial self care strategies to promote recovery Person educated: Patient Education method: Explanation Education comprehension: verbalized understanding  HOME EXERCISE PROGRAM: Access Code: [email or web address] URL: [email or web address] Date: 01/31/2024 Prepared by: Glade Pesa  Exercises - HIP CLOCKS  - 1 x daily - 7 x weekly - 1 sets - 10 reps - Single Leg Deadlift with Kettlebell  - 1 x daily - 7 x weekly - 1 sets - 10 reps - Side Plank with Clam  - 1 x daily - 7 x weekly - 1 sets - 5 reps - Side Plank on Knees  - 1 x daily - 7 x weekly - 1 sets - 5 reps - Bird Dog  - 1 x daily - 7 x weekly - 1 sets - 10 reps - Goblet Squat with Kettlebell  - 1 x daily - 7 x weekly - 2 sets - 5 reps - Single-Leg [place] Deadlift With Kettlebell  - 1 x daily - 7 x weekly - 1 sets - 10 reps - Runner's Climb  - 1 x daily - 7 x weekly - 1 sets - 10 reps - 3 hold - Side Stepping with Resistance at Thighs  - 1 x daily - 7 x weekly - 3 sets - 10 reps - Backward Monster Walk with Resistance (BKA)  - 1 x daily - 7 x weekly - 3 sets - 10 reps - Forward Monster Walk with Resistance (BKA)  - 1 x daily - 7 x weekly - 3 sets - 10 reps - Single Leg Lunge with Foot on Bench  - 1 x daily - 7 x weekly - 1 sets - 10 reps - Lateral Lunge  - 1 x daily - 7 x weekly - 1 sets - 10 reps - Modified Thomas Stretch  - 1 x daily - 7 x weekly - 2 sets - 2 reps - 30 hold - Supine Lower Trunk Rotation  - 1 x daily - 7 x weekly - 2 sets - 10 reps - Supine Bridge  - 1 x daily - 7 x weekly - 2 sets - 10 reps - Clamshell with Resistance  - 1 x daily - 7 x weekly - 2 sets - 10 reps - Supine  Dead Bug with Leg Extension  - 1 x daily - 7 x weekly - 2 sets - 10 reps - Abdominal Bracing  - 1 x daily - 7 x weekly - 2 sets - 10 reps - Side Lunge Adductor Stretch  - 1 x daily - 7 x weekly - 2 sets - 2 reps - 30 hold - Standing Hip Flexor Stretch  - 1 x daily - 7 x weekly - 2 sets - 2 reps - 30 hold - Latissimus Dorsi Stretch at Wall  - 1 x daily - 7 x weekly - 2 sets - 2 reps - 30 hold - Pigeon Pose  - 2 x daily - 7 x weekly - 1 sets - 2 reps - 30 hold - Hooklying Isometric Hip Flexion  - 1 x daily - 7 x weekly - 1 sets - 5 reps - 5 hold - Figure 4 Bridge  - 1 x daily - 7 x weekly - 2 sets - 5 reps - Beginner Front Arm Support  - 1 x  daily - 7 x weekly - 2 sets - 5 reps - 3 hold - Single Leg Balance with Opposite Leg Star Reach  - 1 x daily - 7 x weekly - 2 sets - 5 reps - Sidelying Hip Abduction  - 1 x daily - 7 x weekly - 1 sets - 10 reps - Quadruped on Forearms Bent Knee Hip Extension  - 1 x daily - 7 x weekly - 1 sets - 10 reps - Sidelying Hip Abduction  - 1 x daily - 7 x weekly - 10 sets - 10 reps - Side Plank on Knees  - 1 x daily - 7 x weekly - 1 sets - 5 reps - 10 hold - Single Leg Bridge  - 1 x daily - 7 x weekly - 1 sets - 5-10 reps - 5 hold - Single Leg Heel Raise  - 1 x daily - 7 x weekly - 1 sets - 10 reps - Single Leg Squat with Chair Touch  - 1 x daily - 7 x weekly - 1 sets - 10 reps  ASSESSMENT:  CLINICAL IMPRESSION: Doing very well with aquatic exercise progression. There has been no hip pain with increasing loads. Quad seems to be contracting better and more consistently without pt having to think about it so much.   OBJECTIVE IMPAIRMENTS: decreased activity tolerance, decreased strength, increased fascial restrictions, impaired perceived functional ability, and pain.   ACTIVITY LIMITATIONS: squatting and locomotion level  PARTICIPATION LIMITATIONS: community activity and recreation  PERSONAL FACTORS: Time since onset of injury/illness/exacerbation are also  affecting patient's functional outcome.   REHAB POTENTIAL: Good  CLINICAL DECISION MAKING: Stable/uncomplicated  EVALUATION COMPLEXITY: Low   GOALS: Goals reviewed with patient? Yes  SHORT TERM GOALS: Target date: 11/13/2023    The patient will demonstrate knowledge of basic self care strategies and exercises to promote healing  Baseline: Goal status: met 2/25 2.  The patient will have improved hip strength to at least 4/5 needed for standing, walking longer distances  Baseline:  Goal status: met 2/25  3.  Able to single leg stand on right 10 sec without pelvic drop Baseline:  Goal status: met 2/25      LONG TERM GOALS: Target date:04/24/2024        The patient will be independent in a safe self progression of a home exercise program to promote further recovery of function  Baseline:  Goal status: ongoing  2.  The patient will have improved hip strength to at least 4+/5 needed for standing, walking longer distances Baseline:  Goal status: ongoing  3.  Able to do a full squat without compensatory shifting Baseline:  Goal status: ongoing  4.  Able to walk 3 miles for exercise/recreation Baseline:  Goal status: ongoing 5.  The patient will have improved FOTO score to   76%    indicating improved function with less pain  Baseline:  Goal status: ongoing  6.  PSFS for walking 2 miles improved to 7  7.  PSFS for running to play frisbee improved to 3  8. PSFS for lifting heavier objects improved to 7    PLAN:  PT FREQUENCY: weekly PT DURATION: 12 weeks  PLANNED INTERVENTIONS: 97164- PT Re-evaluation, 97110-Therapeutic exercises, 97530- Therapeutic activity, 97112- Neuromuscular re-education, 97535- Self Care, [id number]- Manual therapy, [id number]- Aquatic Therapy, 97014- Electrical stimulation (unattended), [id number]- Electrical stimulation (manual), [id number]- Ultrasound, [id number]- Ionotophoresis 4mg /ml Dexamethasone, Taping, Dry Needling, Joint mobilization, Moist heat, and  Biofeedback  PLAN FOR NEXT SESSION: Pt  has 2 more aquatic sessions left  Delon Darner, PTA 03/19/24 12:19 PM

## 2024-03-27 ENCOUNTER — Ambulatory Visit: Attending: Family Medicine | Admitting: Physical Therapy

## 2024-03-27 DIAGNOSIS — M6281 Muscle weakness (generalized): Secondary | ICD-10-CM | POA: Insufficient documentation

## 2024-03-27 DIAGNOSIS — R293 Abnormal posture: Secondary | ICD-10-CM | POA: Insufficient documentation

## 2024-03-27 DIAGNOSIS — R252 Cramp and spasm: Secondary | ICD-10-CM | POA: Insufficient documentation

## 2024-03-27 DIAGNOSIS — M25651 Stiffness of right hip, not elsewhere classified: Secondary | ICD-10-CM | POA: Insufficient documentation

## 2024-03-27 DIAGNOSIS — M25551 Pain in right hip: Secondary | ICD-10-CM | POA: Insufficient documentation

## 2024-03-27 NOTE — Therapy (Signed)
 OUTPATIENT PHYSICAL THERAPY LOWER EXTREMITY PROGRESS NOTE   Patient Name: Stephanie Walton MRN: [id number] DOB:Aug 16, 1971, 53 y.o., female Today's Date: 03/27/2024  END OF SESSION:  PT End of Session - 03/27/24 0802     Visit Number 12    Date for PT Re-Evaluation 04/24/24    Authorization Type [employer]    PT Start Time 0800    PT Stop Time 0840    PT Time Calculation (min) 40 min    Activity Tolerance Patient tolerated treatment well            No past medical history on file. No past surgical history on file. 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 I went to the pool on my own on Saturday for an hour and then went to a dance that night.  I might have overdone it.  Rested Sunday then went to the gym on Tuesday.  Everything is fine again.  I want to work on keeping my arch from collapsing. I would love to see if I can get off that box with 1 leg.    Cycling [place] and [place] October  EVAL:New imaging shows Anterior superior right labral hip tear.  Symptoms present for years.   2 miles walking is the limit at one time.  Have to be careful with swing dancing.  Have appt with surgeon [name] on the 23rd.   I joined a Engineer, site 30 min TM walking, leg press (feels good),hip abductor/adductor 55#, lats, row; PVC trunk rotation; leg swings;  I haven't done my other home ex's in a while but going to start back Standing abdominal leg lifts will produce popping right hip   PERTINENT HISTORY: History of Right ankle peroneal tendonitis History of disc bulge PAIN:    Are you having pain? no NPRS scale:  0/10 Pain location: right buttock lateral Pain orientation: Right  Pain description: intermittent  Aggravating factors: walking 2 miles, popping with certain movements Relieving factors: some of the exercises  PRECAUTIONS: None    WEIGHT BEARING RESTRICTIONS: No  FALLS:  Has patient fallen in last 6 months? No LIVING ENVIRONMENT: Lives with: lives with their spouse Lives in: House/apartment Stairs: Yes: Internal: 16 steps; on right going up and External: 3 steps; on right going up Has following equipment at home: None  OCCUPATION: full time, international travel, standing desk and lab work   PLOF: Independent  PATIENT GOALS: determine if surgery is needed or if PT can manage this problem  NEXT MD VISIT: consult with surgeon [name] later this month  OBJECTIVE:  Note: Objective measures were completed at Evaluation unless otherwise noted.  DIAGNOSTIC FINDINGS: 08/29/23: MR OF THE RIGHT HIP WITHOUT CONTRAST   TECHNIQUE: Multiplanar, multisequence MR imaging was performed. No intravenous contrast was administered.   COMPARISON:  None Available.   FINDINGS: Bones: Unremarkable   Articular cartilage and labrum   Articular cartilage:  Unremarkable   Labrum:  Torn anterior superior labrum, image 17 series 13.   Joint or bursal effusion   Joint effusion:  Absent   Bursae: No regional  bursitis.   Muscles and tendons   Muscles and tendons: Mild band of edema anterolaterally in the gluteus medius muscle, images 5 through 12 series 11, potentially from muscle strain.   Other findings   Miscellaneous:   No sciatic notch impingement.   IMPRESSION: 1. Torn right acetabular anterior superior labrum. 2. Mild band of edema anterolaterally in the gluteus medius muscle, potentially from muscle strain.      PATIENT SURVEYS:  FOTO 67% 3/4: 73% 5/8: 65%  COGNITION: Overall cognitive status: Within functional limits for  tasks assessed     PALPATION: Rt  glut min, glut med, piriformis tender to palpation  LOWER EXTREMITY ROM:  full and symmetrical hip ROM although pt is fearful with passive hip internal and external rotation; no pain with full knee to chest hip flexion;  decreased right hip flexor length noted in sidelying  LOWER EXTREMITY MMT:  right hip abduction 4/5;  right hip flexion and extension 4+/5  TRUNK STRENGTH:  Decreased activation of transverse abdominus muscles; abdominals 4/5; decreased activation of lumbar multifidi; trunk extensors 4/5 Decreased stability noted with bird dogs (WB on right knee)   LOWER EXTREMITY SPECIAL TESTS:  Immediate Pelvic drop with SLS on right   FUNCTIONAL TESTS:  Able to do a full squat however shifts toward the left on descent and rise  GAIT: Comments: WNLs for clinic ambulation The Patient-Specific Functional Scale  Initial:  I am going to ask you to identify up to 3 important activities that you are unable to do or are having difficulty with as a result of this problem.  Today are there any activities that you are unable to do or having difficulty with because of this?  (Patient shown scale and patient rated each activity)  Follow up: When you first came in you had difficulty performing these activities.  Today do you still have difficulty?  Patient-Specific activity scoring scheme (Point to one number):  0 1 2 3 4 5 6 7 8 9  10 Unable                                                                                                          Able to perform To perform                                                                                                    activity at the same Activity         Level as before  Injury or problem  Activity           Walking 2 miles                                                                       Initial:       5-6                  2.              running a little for playing frisbee or jogging                    Initial:             0           3.              Be able to lift heavier things/furniture                                  Initial:      5-6                     TREATMENT DATE: 03/27/24  Bike 7 min L2 while during status update Wall great toe/calf stretch 20 sec 5x Toes extended on towel with heel raise 10x Arch lifts in standing (neuromuscular control difficult on right) Staggered stance right leg back STS from 24 inch 10x (much easier than last visit Resisted backward walking  with belt 15# cable pulley with emphasis on push off/toe lift 20x Up and down steps reciprocally (much for fluid with descending steps compared to prior visit) Mini trampoline 1 minute each: lateral shifting quick tempo, light jogging, staggered stance, bouncing place Single leg RDL 10# KB 10x right/left Red band pinned under great toe with RDL 5x (challenging)    03/19/24:Pt arrives for aquatic physical therapy. Treatment took place in 3.5-5.5 feet of water. Water temperature was 91 degrees F. Pt entered the pool via stairs independently. Pt requires buoyancy of water for support and to offload joints with strengthening exercises.  Pt utilizes viscosity of the water required for strengthening. 75% depth water walking with UE push/pull using single buoy wts.10x in each direction with some lengths being more of a jog/bounding. Hip 3 ways with added ankle fins 15x in each direction. Standing core contractions with pink water bells 2x10 push forward and back. 10  min underwater bicycle with ankle fins on to increase load for push/pull on horseback.  03/06/24  Bike 10 min L2 while during status update  Staggered stance right leg back STS from 24 inch 10x Spanish squats with purple power cord around right knee 10x 30# barbell deadlifts from 24 inch height 12x Discussed toe spacers for aid in great toe  alignment Resisted backward walking 10# cable pulley with emphasis on push off/toe lift 20x Static isometric heel raise with weight shift with finger touch for 45 sec Heel raises and lowers in box formation finger touch for balance 5x each way Ascending and descending steps heel comes off early Calf wall stretch 20 sec  right      Review of MRI  particular glute medius aspect FOTO PSFS as above SLS on right pelvic drop noted Airplane position on right with pelvic rotation noted, difficulty stabilizing Return to Running Criteria (from Groome et al, 2019)  Ability to perform 20-30 repetitions of each of the following without fatigue:  Single calf raise: unable to tolerate on right  (added supported calf raise to HEP) Single leg bridge right (causes HS cramping but OK with isometric hold-- added to HEP) Single leg sit to stand: can do on right from 24inch height with cues to avoid compensatory pelvic rotation) (added to HEP)  Sidelying hip abduction (try against wall to limit hip flexor use) (added to HEP)  Side planks isometric hold 10 sec  on right (added to HEP)      PATIENT EDUCATION:  Education details: Educated patient on anatomy and physiology of current symptoms, prognosis, plan of care as well as initial self care strategies to promote recovery Person educated: Patient Education method: Explanation Education comprehension: verbalized understanding  HOME EXERCISE PROGRAM: Access Code: [email or web address] URL: [email or web address] Date: 01/31/2024 Prepared by: Glade Pesa  Exercises - HIP CLOCKS  - 1 x daily - 7 x weekly - 1 sets - 10 reps - Single Leg Deadlift with Kettlebell  - 1 x daily - 7 x weekly - 1 sets - 10 reps - Side Plank with Clam  - 1 x daily - 7 x weekly - 1 sets - 5 reps - Side Plank on Knees  - 1 x daily - 7 x weekly - 1 sets - 5 reps - Bird Dog  - 1 x daily - 7 x weekly - 1 sets - 10 reps - Goblet Squat with Kettlebell  - 1 x daily - 7 x weekly -  2 sets - 5 reps - Single-Leg [place] Deadlift With Kettlebell  - 1 x daily - 7 x weekly - 1 sets - 10 reps - Runner's Climb  - 1 x daily - 7 x weekly - 1 sets - 10 reps - 3 hold - Side Stepping with Resistance at Thighs  - 1 x daily - 7 x weekly - 3 sets - 10 reps - Backward Monster Walk with Resistance (BKA)  - 1 x daily - 7 x weekly - 3 sets - 10 reps - Forward Monster Walk with Resistance (BKA)  - 1 x daily - 7 x weekly - 3 sets - 10 reps - Single Leg Lunge with Foot on Bench  - 1 x daily - 7 x weekly - 1 sets - 10 reps - Lateral Lunge  - 1 x daily - 7 x weekly - 1 sets - 10 reps - Modified Thomas Stretch  - 1 x daily - 7 x weekly - 2 sets - 2 reps - 30 hold - Supine Lower Trunk Rotation  - 1 x daily - 7 x weekly - 2 sets - 10 reps - Supine Bridge  - 1 x daily - 7 x weekly - 2 sets - 10 reps - Clamshell with Resistance  - 1 x daily - 7 x weekly - 2 sets - 10 reps - Supine Dead Bug with Leg Extension  - 1 x daily - 7 x weekly - 2 sets - 10 reps - Abdominal Bracing  - 1 x daily - 7 x weekly - 2 sets - 10 reps - Side Lunge Adductor Stretch  - 1 x daily - 7 x weekly - 2 sets - 2 reps - 30 hold - Standing Hip Flexor  Stretch  - 1 x daily - 7 x weekly - 2 sets - 2 reps - 30 hold - Latissimus Dorsi Stretch at Wall  - 1 x daily - 7 x weekly - 2 sets - 2 reps - 30 hold - Pigeon Pose  - 2 x daily - 7 x weekly - 1 sets - 2 reps - 30 hold - Hooklying Isometric Hip Flexion  - 1 x daily - 7 x weekly - 1 sets - 5 reps - 5 hold - Figure 4 Bridge  - 1 x daily - 7 x weekly - 2 sets - 5 reps - Beginner Front Arm Support  - 1 x daily - 7 x weekly - 2 sets - 5 reps - 3 hold - Single Leg Balance with Opposite Leg Star Reach  - 1 x daily - 7 x weekly - 2 sets - 5 reps - Sidelying Hip Abduction  - 1 x daily - 7 x weekly - 1 sets - 10 reps - Quadruped on Forearms Bent Knee Hip Extension  - 1 x daily - 7 x weekly - 1 sets - 10 reps - Sidelying Hip Abduction  - 1 x daily - 7 x weekly - 10 sets - 10 reps - Side  Plank on Knees  - 1 x daily - 7 x weekly - 1 sets - 5 reps - 10 hold - Single Leg Bridge  - 1 x daily - 7 x weekly - 1 sets - 5-10 reps - 5 hold - Single Leg Heel Raise  - 1 x daily - 7 x weekly - 1 sets - 10 reps - Single Leg Squat with Chair Touch  - 1 x daily - 7 x weekly - 1 sets - 10 reps  ASSESSMENT:  CLINICAL IMPRESSION:  Traeh is progressing well with motor control of right quads and gluteals.  In addition to targeting activation of these muscle groups we also focused on neutral control of the foot rather than overpronation which can affect alignment at the hip and knee.  She is highly compliant with her HEP which further improves her long term prognosis.  Verbal cues for optimal technique given.     OBJECTIVE IMPAIRMENTS: decreased activity tolerance, decreased strength, increased fascial restrictions, impaired perceived functional ability, and pain.   ACTIVITY LIMITATIONS: squatting and locomotion level  PARTICIPATION LIMITATIONS: community activity and recreation  PERSONAL FACTORS: Time since onset of injury/illness/exacerbation are also affecting patient's functional outcome.   REHAB POTENTIAL: Good  CLINICAL DECISION MAKING: Stable/uncomplicated  EVALUATION COMPLEXITY: Low   GOALS: Goals reviewed with patient? Yes  SHORT TERM GOALS: Target date: 11/13/2023    The patient will demonstrate knowledge of basic self care strategies and exercises to promote healing  Baseline: Goal status: met 2/25 2.  The patient will have improved hip strength to at least 4/5 needed for standing, walking longer distances  Baseline:  Goal status: met 2/25  3.  Able to single leg stand on right 10 sec without pelvic drop Baseline:  Goal status: met 2/25      LONG TERM GOALS: Target date:04/24/2024        The patient will be independent in a safe self progression of a home exercise program to promote further recovery of function  Baseline:  Goal status: ongoing  2.  The  patient will have improved hip strength to at least 4+/5 needed for standing, walking longer distances Baseline:  Goal status: ongoing  3.  Able to do a full  squat without compensatory shifting Baseline:  Goal status: ongoing  4.  Able to walk 3 miles for exercise/recreation Baseline:  Goal status: ongoing 5.  The patient will have improved FOTO score to   76%    indicating improved function with less pain  Baseline:  Goal status: ongoing  6.  PSFS for walking 2 miles improved to 7  7.  PSFS for running to play frisbee improved to 3  8. PSFS for lifting heavier objects improved to 7    PLAN:  PT FREQUENCY: weekly PT DURATION: 12 weeks  PLANNED INTERVENTIONS: 97164- PT Re-evaluation, 97110-Therapeutic exercises, 97530- Therapeutic activity, 97112- Neuromuscular re-education, 97535- Self Care, [id number]- Manual therapy, [id number]- Aquatic Therapy, 97014- Electrical stimulation (unattended), [id number]- Electrical stimulation (manual), [id number]- Ultrasound, [id number]- Ionotophoresis 4mg /ml Dexamethasone, Taping, Dry Needling, Joint mobilization, Moist heat, and Biofeedback  PLAN FOR NEXT SESSION: Pt has 2 more aquatic sessions left; lower quarter strengthening particularly glutes, quads, foot intrinsics  Glade Pesa, PT 03/27/24 8:20 PM Phone: [phone] Fax: [phone]

## 2024-04-02 ENCOUNTER — Encounter: Payer: Self-pay | Admitting: Physical Therapy

## 2024-04-02 ENCOUNTER — Ambulatory Visit: Admitting: Physical Therapy

## 2024-04-02 DIAGNOSIS — M25551 Pain in right hip: Secondary | ICD-10-CM | POA: Diagnosis not present

## 2024-04-02 DIAGNOSIS — R252 Cramp and spasm: Secondary | ICD-10-CM | POA: Diagnosis not present

## 2024-04-02 DIAGNOSIS — R293 Abnormal posture: Secondary | ICD-10-CM | POA: Diagnosis not present

## 2024-04-02 DIAGNOSIS — M25651 Stiffness of right hip, not elsewhere classified: Secondary | ICD-10-CM | POA: Diagnosis not present

## 2024-04-02 DIAGNOSIS — M6281 Muscle weakness (generalized): Secondary | ICD-10-CM | POA: Diagnosis not present

## 2024-04-02 NOTE — Therapy (Signed)
 OUTPATIENT PHYSICAL THERAPY LOWER EXTREMITY PROGRESS NOTE   Patient Name: Stephanie Walton MRN: [id number] DOB:June 19, 1971, 53 y.o., female Today's Date: 04/02/2024  END OF SESSION:  PT End of Session - 04/02/24 0927     Visit Number 13    Date for PT Re-Evaluation 04/24/24    Authorization Type BC[employer]    PT Start Time 0927    PT Stop Time 1015    PT Time Calculation (min) 48 min    Activity Tolerance Patient tolerated treatment well    Behavior During Therapy [facility] for tasks assessed/performed             History reviewed. No pertinent past medical history. History reviewed. No pertinent surgical history. 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  Cycling [place] and [place] October  EVAL:New imaging shows Anterior superior right labral hip tear.  Symptoms present for years.   2 miles walking is the limit at one time.  Have to be careful with swing dancing.  Have appt with surgeon [name] on the 23rd.   I joined a Engineer, site 30 min TM walking, leg press (feels good),hip abductor/adductor 55#, lats, row; PVC trunk rotation; leg swings;  I haven't done my other home ex's in a while but going to start back Standing abdominal leg lifts will produce popping right hip   PERTINENT HISTORY: History of Right ankle peroneal tendonitis History of disc bulge PAIN:   Are you having pain? no NPRS scale:  0/10 Pain location: right buttock lateral Pain orientation: Right  Pain description: intermittent  Aggravating factors:  walking 2 miles, popping with certain movements Relieving factors: some of the exercises  PRECAUTIONS: None    WEIGHT BEARING RESTRICTIONS: No  FALLS:  Has patient fallen in last 6 months? No LIVING ENVIRONMENT: Lives with: lives with their spouse Lives in: House/apartment Stairs: Yes: Internal: 16 steps; on right going up and External: 3 steps; on right going up Has following equipment at home: None  OCCUPATION: full time, international travel, standing desk and lab work   PLOF: Independent  PATIENT GOALS: determine if surgery is needed or if PT can manage this problem  NEXT MD VISIT: consult with surgeon [name] later this month  OBJECTIVE:  Note: Objective measures were completed at Evaluation unless otherwise noted.  DIAGNOSTIC FINDINGS: 08/29/23: MR OF THE RIGHT HIP WITHOUT CONTRAST   TECHNIQUE: Multiplanar, multisequence MR imaging was performed. No intravenous contrast was administered.   COMPARISON:  None Available.   FINDINGS: Bones: Unremarkable   Articular cartilage and labrum   Articular cartilage:  Unremarkable   Labrum:  Torn anterior superior labrum, image 17 series 13.   Joint or bursal effusion   Joint effusion:  Absent   Bursae: No regional bursitis.   Muscles and tendons   Muscles and tendons: Mild band of edema anterolaterally in the gluteus medius muscle, images 5 through 12 series 11, potentially from muscle strain.   Other findings   Miscellaneous:   No sciatic notch impingement.   IMPRESSION: 1. Torn right acetabular  anterior superior labrum. 2. Mild band of edema anterolaterally in the gluteus medius muscle, potentially from muscle strain.      PATIENT SURVEYS:  FOTO 67% 3/4: 73% 5/8: 65%  COGNITION: Overall cognitive status: Within functional limits for tasks assessed     PALPATION: Rt  glut min, glut med, piriformis tender to palpation  LOWER EXTREMITY ROM:  full and symmetrical hip ROM although pt is fearful  with passive hip internal and external rotation; no pain with full knee to chest hip flexion;  decreased right hip flexor length noted in sidelying  LOWER EXTREMITY MMT:  right hip abduction 4/5;  right hip flexion and extension 4+/5  TRUNK STRENGTH:  Decreased activation of transverse abdominus muscles; abdominals 4/5; decreased activation of lumbar multifidi; trunk extensors 4/5 Decreased stability noted with bird dogs (WB on right knee)   LOWER EXTREMITY SPECIAL TESTS:  Immediate Pelvic drop with SLS on right   FUNCTIONAL TESTS:  Able to do a full squat however shifts toward the left on descent and rise  GAIT: Comments: WNLs for clinic ambulation The Patient-Specific Functional Scale  Initial:  I am going to ask you to identify up to 3 important activities that you are unable to do or are having difficulty with as a result of this problem.  Today are there any activities that you are unable to do or having difficulty with because of this?  (Patient shown scale and patient rated each activity)  Follow up: When you first came in you had difficulty performing these activities.  Today do you still have difficulty?  Patient-Specific activity scoring scheme (Point to one number):  0 1 2 3 4 5 6 7 8 9  10 Unable                                                                                                          Able to perform To perform                                                                                                    activity at the same Activity         Level as before  Injury or problem  Activity           Walking 2 miles                                                                      Initial:       5-6                  2.              running a little for playing frisbee or jogging                    Initial:             0           3.               Be able to lift heavier things/furniture                                  Initial:      5-6                     TREATMENT DATE:  04/02/24:Pt arrives for aquatic physical therapy. Treatment took place in 3.5-5.5 feet of water. Water temperature was 91 degrees F. Pt entered the pool via stairs independently. Pt requires buoyancy of water for support and to offload joints with strengthening exercises.  Pt utilizes viscosity of the water required for strengthening. 75% depth water walking with UE push/pull using single buoy wts.10x in each direction with some lengths being more of a jog/bounding with ankle fins on. Hip 3 ways with ankle fins 30x in each direction. Standing core contractions with pink water bells 2x20 push forward and back. 10  min underwater bicycle with ankle fins on to increase load for push/pull on horseback.  03/27/24  Bike 7 min L2 while during status update Wall great toe/calf stretch 20 sec 5x Toes extended on towel with heel raise 10x Arch lifts in standing (neuromuscular control difficult on right) Staggered stance right leg back STS from 24 inch 10x (much easier than last visit Resisted backward walking  with belt 15# cable pulley with emphasis on push off/toe lift 20x Up and down steps reciprocally (much for fluid with descending steps compared to prior visit) Mini trampoline 1 minute each: lateral shifting quick tempo, light jogging, staggered stance, bouncing place Single leg RDL 10# KB 10x right/left Red band pinned under great toe with RDL 5x (challenging)    03/19/24:Pt arrives for aquatic physical therapy. Treatment took place in 3.5-5.5 feet of water. Water temperature was 91 degrees F. Pt entered the pool via stairs independently. Pt requires buoyancy of water for support and to offload joints with strengthening exercises.  Pt utilizes viscosity of the water required for strengthening. 75% depth water walking with UE push/pull using single buoy wts.10x in each  direction with some lengths being more of a jog/bounding. Hip 3 ways with added ankle fins 15x in each direction. Standing core contractions with pink water bells 2x10 push forward and back. 10  min underwater bicycle with ankle fins on to increase  load for push/pull on horseback.     PATIENT EDUCATION:  Education details: Educated patient on anatomy and physiology of current symptoms, prognosis, plan of care as well as initial self care strategies to promote recovery Person educated: Patient Education method: Explanation Education comprehension: verbalized understanding  HOME EXERCISE PROGRAM: Access Code: [email or web address] URL: [email or web address] Date: 01/31/2024 Prepared by: Glade Pesa  Exercises - HIP CLOCKS  - 1 x daily - 7 x weekly - 1 sets - 10 reps - Single Leg Deadlift with Kettlebell  - 1 x daily - 7 x weekly - 1 sets - 10 reps - Side Plank with Clam  - 1 x daily - 7 x weekly - 1 sets - 5 reps - Side Plank on Knees  - 1 x daily - 7 x weekly - 1 sets - 5 reps - Bird Dog  - 1 x daily - 7 x weekly - 1 sets - 10 reps - Goblet Squat with Kettlebell  - 1 x daily - 7 x weekly - 2 sets - 5 reps - Single-Leg [place] Deadlift With Kettlebell  - 1 x daily - 7 x weekly - 1 sets - 10 reps - Runner's Climb  - 1 x daily - 7 x weekly - 1 sets - 10 reps - 3 hold - Side Stepping with Resistance at Thighs  - 1 x daily - 7 x weekly - 3 sets - 10 reps - Backward Monster Walk with Resistance (BKA)  - 1 x daily - 7 x weekly - 3 sets - 10 reps - Forward Monster Walk with Resistance (BKA)  - 1 x daily - 7 x weekly - 3 sets - 10 reps - Single Leg Lunge with Foot on Bench  - 1 x daily - 7 x weekly - 1 sets - 10 reps - Lateral Lunge  - 1 x daily - 7 x weekly - 1 sets - 10 reps - Modified Thomas Stretch  - 1 x daily - 7 x weekly - 2 sets - 2 reps - 30 hold - Supine Lower Trunk Rotation  - 1 x daily - 7 x weekly - 2 sets - 10 reps - Supine Bridge  - 1 x daily - 7 x weekly - 2 sets - 10  reps - Clamshell with Resistance  - 1 x daily - 7 x weekly - 2 sets - 10 reps - Supine Dead Bug with Leg Extension  - 1 x daily - 7 x weekly - 2 sets - 10 reps - Abdominal Bracing  - 1 x daily - 7 x weekly - 2 sets - 10 reps - Side Lunge Adductor Stretch  - 1 x daily - 7 x weekly - 2 sets - 2 reps - 30 hold - Standing Hip Flexor Stretch  - 1 x daily - 7 x weekly - 2 sets - 2 reps - 30 hold - Latissimus Dorsi Stretch at Wall  - 1 x daily - 7 x weekly - 2 sets - 2 reps - 30 hold - Pigeon Pose  - 2 x daily - 7 x weekly - 1 sets - 2 reps - 30 hold - Hooklying Isometric Hip Flexion  - 1 x daily - 7 x weekly - 1 sets - 5 reps - 5 hold - Figure 4 Bridge  - 1 x daily - 7 x weekly - 2 sets - 5 reps - Beginner Front Arm Support  - 1 x daily - 7 x weekly - 2 sets -  5 reps - 3 hold - Single Leg Balance with Opposite Leg Star Reach  - 1 x daily - 7 x weekly - 2 sets - 5 reps - Sidelying Hip Abduction  - 1 x daily - 7 x weekly - 1 sets - 10 reps - Quadruped on Forearms Bent Knee Hip Extension  - 1 x daily - 7 x weekly - 1 sets - 10 reps - Sidelying Hip Abduction  - 1 x daily - 7 x weekly - 10 sets - 10 reps - Side Plank on Knees  - 1 x daily - 7 x weekly - 1 sets - 5 reps - 10 hold - Single Leg Bridge  - 1 x daily - 7 x weekly - 1 sets - 5-10 reps - 5 hold - Single Leg Heel Raise  - 1 x daily - 7 x weekly - 1 sets - 10 reps - Single Leg Squat with Chair Touch  - 1 x daily - 7 x weekly - 1 sets - 10 reps  ASSESSMENT:  CLINICAL IMPRESSION: pt continues to exercise in and out of the pool painfree. We significantly increased workload today which pt had no issues with. Steadiness is not perfect and requires an occasional re-balance moment.   OBJECTIVE IMPAIRMENTS: decreased activity tolerance, decreased strength, increased fascial restrictions, impaired perceived functional ability, and pain.   ACTIVITY LIMITATIONS: squatting and locomotion level  PARTICIPATION LIMITATIONS: community activity and  recreation  PERSONAL FACTORS: Time since onset of injury/illness/exacerbation are also affecting patient's functional outcome.   REHAB POTENTIAL: Good  CLINICAL DECISION MAKING: Stable/uncomplicated  EVALUATION COMPLEXITY: Low   GOALS: Goals reviewed with patient? Yes  SHORT TERM GOALS: Target date: 11/13/2023    The patient will demonstrate knowledge of basic self care strategies and exercises to promote healing  Baseline: Goal status: met 2/25 2.  The patient will have improved hip strength to at least 4/5 needed for standing, walking longer distances  Baseline:  Goal status: met 2/25  3.  Able to single leg stand on right 10 sec without pelvic drop Baseline:  Goal status: met 2/25      LONG TERM GOALS: Target date:04/24/2024        The patient will be independent in a safe self progression of a home exercise program to promote further recovery of function  Baseline:  Goal status: ongoing  2.  The patient will have improved hip strength to at least 4+/5 needed for standing, walking longer distances Baseline:  Goal status: ongoing  3.  Able to do a full squat without compensatory shifting Baseline:  Goal status: ongoing  4.  Able to walk 3 miles for exercise/recreation Baseline:  Goal status: ongoing 5.  The patient will have improved FOTO score to   76%    indicating improved function with less pain  Baseline:  Goal status: ongoing  6.  PSFS for walking 2 miles improved to 7  7.  PSFS for running to play frisbee improved to 3  8. PSFS for lifting heavier objects improved to 7    PLAN:  PT FREQUENCY: weekly PT DURATION: 12 weeks  PLANNED INTERVENTIONS: 97164- PT Re-evaluation, 97110-Therapeutic exercises, 97530- Therapeutic activity, [id number]- Neuromuscular re-education, 97535- Self Care, [id number]- Manual therapy, [id number]- Aquatic Therapy, 97014- Electrical stimulation (unattended), [id number]- Electrical stimulation (manual), [id number]- Ultrasound, [id number]-  Ionotophoresis 4mg /ml Dexamethasone, Taping, Dry Needling, Joint mobilization, Moist heat, and Biofeedback  PLAN FOR NEXT SESSION: Pt has 1 more aquatic sessions left; lower quarter strengthening particularly glutes,  quads, foot intrinsics  Delon Darner, PTA 04/02/24 12:40 PM

## 2024-04-08 ENCOUNTER — Ambulatory Visit: Admitting: Physical Therapy

## 2024-04-08 DIAGNOSIS — M25651 Stiffness of right hip, not elsewhere classified: Secondary | ICD-10-CM | POA: Diagnosis not present

## 2024-04-08 DIAGNOSIS — R252 Cramp and spasm: Secondary | ICD-10-CM | POA: Diagnosis not present

## 2024-04-08 DIAGNOSIS — M25551 Pain in right hip: Secondary | ICD-10-CM | POA: Diagnosis not present

## 2024-04-08 DIAGNOSIS — R293 Abnormal posture: Secondary | ICD-10-CM | POA: Diagnosis not present

## 2024-04-08 DIAGNOSIS — M6281 Muscle weakness (generalized): Secondary | ICD-10-CM | POA: Diagnosis not present

## 2024-04-08 NOTE — Therapy (Signed)
 OUTPATIENT PHYSICAL THERAPY LOWER EXTREMITY PROGRESS NOTE   Patient Name: Stephanie Walton MRN: [id number] DOB:[birth year], 53 y.o., female Today's Date: 04/08/2024  END OF SESSION:  PT End of Session - 04/08/24 1530     Visit Number 14    Date for PT Re-Evaluation 04/24/24    Authorization Type [employer]    PT Start Time 1531    PT Stop Time 1614    PT Time Calculation (min) 43 min    Activity Tolerance Patient tolerated treatment well             No past medical history on file. No past surgical history on file. 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  I'm working out at the pool.  Going to the beach this weekend and will be doing lots of walking.    Cycling [place] and [place] October  EVAL:New imaging shows Anterior superior right labral hip tear.  Symptoms present for years.   2 miles walking is the limit at one time.  Have to be careful with swing dancing.  Have appt with surgeon [name] on the 23rd.   I joined a Engineer, site 30 min TM walking, leg press (feels good),hip abductor/adductor 55#, lats, row; PVC trunk rotation; leg swings;  I haven't done my other home ex's in a while but going to start back Standing abdominal leg lifts will produce popping right hip   PERTINENT HISTORY: History of Right ankle peroneal tendonitis History of disc bulge PAIN:   Are you having pain? no NPRS scale:  0/10 Pain location: right buttock lateral Pain orientation: Right  Pain description: intermittent  Aggravating factors: walking 2  miles, popping with certain movements Relieving factors: some of the exercises  PRECAUTIONS: None    WEIGHT BEARING RESTRICTIONS: No  FALLS:  Has patient fallen in last 6 months? No LIVING ENVIRONMENT: Lives with: lives with their spouse Lives in: House/apartment Stairs: Yes: Internal: 16 steps; on right going up and External: 3 steps; on right going up Has following equipment at home: None  OCCUPATION: full time, international travel, standing desk and lab work   PLOF: Independent  PATIENT GOALS: determine if surgery is needed or if PT can manage this problem  NEXT MD VISIT: consult with surgeon [name] later this month  OBJECTIVE:  Note: Objective measures were completed at Evaluation unless otherwise noted.  DIAGNOSTIC FINDINGS: 08/29/23: MR OF THE RIGHT HIP WITHOUT CONTRAST   TECHNIQUE: Multiplanar, multisequence MR imaging was performed. No intravenous contrast was administered.   COMPARISON:  None Available.   FINDINGS: Bones: Unremarkable   Articular cartilage and labrum   Articular cartilage:  Unremarkable   Labrum:  Torn anterior superior labrum, image 17 series 13.   Joint or bursal effusion   Joint effusion:  Absent   Bursae: No regional bursitis.   Muscles and tendons   Muscles and tendons: Mild band of edema anterolaterally in the gluteus medius muscle, images 5 through 12 series 11, potentially from muscle strain.   Other findings   Miscellaneous:  No sciatic notch impingement.   IMPRESSION: 1. Torn right acetabular anterior superior labrum. 2. Mild band of edema anterolaterally in the gluteus medius muscle, potentially from muscle strain.      PATIENT SURVEYS:  FOTO 67% 3/4: 73% 5/8: 65%  COGNITION: Overall cognitive status: Within functional limits for tasks assessed     PALPATION: Rt  glut min, glut med, piriformis tender to palpation  LOWER EXTREMITY ROM:  full and symmetrical hip ROM although pt is fearful with  passive hip internal and external rotation; no pain with full knee to chest hip flexion;  decreased right hip flexor length noted in sidelying  LOWER EXTREMITY MMT:  right hip abduction 4/5;  right hip flexion and extension 4+/5  TRUNK STRENGTH:  Decreased activation of transverse abdominus muscles; abdominals 4/5; decreased activation of lumbar multifidi; trunk extensors 4/5 Decreased stability noted with bird dogs (WB on right knee)   LOWER EXTREMITY SPECIAL TESTS:  Immediate Pelvic drop with SLS on right   FUNCTIONAL TESTS:  Able to do a full squat however shifts toward the left on descent and rise  GAIT: Comments: WNLs for clinic ambulation The Patient-Specific Functional Scale  Initial:  I am going to ask you to identify up to 3 important activities that you are unable to do or are having difficulty with as a result of this problem.  Today are there any activities that you are unable to do or having difficulty with because of this?  (Patient shown scale and patient rated each activity)  Follow up: When you first came in you had difficulty performing these activities.  Today do you still have difficulty?  Patient-Specific activity scoring scheme (Point to one number):  0 1 2 3 4 5 6 7 8 9  10 Unable                                                                                                          Able to perform To perform                                                                                                    activity at the same Activity         Level as before  Injury or problem  Activity           Walking 2 miles                                                                      Initial:       5-6                  2.              running a little for playing frisbee or jogging                    Initial:             0           3.               Be able to lift heavier things/furniture                                  Initial:      5-6                     TREATMENT DATE: 04/08/24  Bike 10 min L2 while during status update Facing wall mountain climbers slow and fast speed 10x Lateral to the wall mountain climbers 10x right/left  Single leg STS from 24 inch 10x  SLS in front of mirror with active arch lifts while also engaging glutes right/left Resisted backward walking  with belt 15# cable pulley with emphasis on push off/toe lift 10x; forward walking 15# 10x (very challenging) Mini trampoline 1 minute each: lateral shifting quick tempo, light jogging, staggered stance, bouncing place   04/02/24:Pt arrives for aquatic physical therapy. Treatment took place in 3.5-5.5 feet of water. Water temperature was 91 degrees F. Pt entered the pool via stairs independently. Pt requires buoyancy of water for support and to offload joints with strengthening exercises.  Pt utilizes viscosity of the water required for strengthening. 75% depth water walking with UE push/pull using single buoy wts.10x in each direction with some lengths being more of a jog/bounding with ankle fins on. Hip 3 ways with ankle fins 30x in each direction. Standing core contractions with pink water bells 2x20 push forward and back. 10  min underwater bicycle with ankle fins on to increase load for push/pull on horseback.  03/27/24  Bike 7 min L2 while during status update Wall great toe/calf stretch 20 sec 5x Toes extended on towel with heel raise 10x Arch lifts in standing (neuromuscular control difficult on right) Staggered stance right leg back STS from 24 inch 10x (much easier than last visit Resisted backward walking  with belt 15# cable pulley with emphasis on push off/toe lift 20x Up and down steps reciprocally (much for fluid with descending steps compared to prior visit) Mini trampoline 1 minute each: lateral shifting quick tempo, light jogging, staggered stance,  bouncing place Single leg RDL 10# KB 10x right/left Red band pinned under great toe with RDL 5x (challenging)      PATIENT EDUCATION:  Education details: Educated patient on anatomy and physiology of current symptoms, prognosis, plan of  care as well as initial self care strategies to promote recovery Person educated: Patient Education method: Explanation Education comprehension: verbalized understanding  HOME EXERCISE PROGRAM: Access Code: [email or web address] URL: [email or web address] Date: 01/31/2024 Prepared by: Glade Pesa  Exercises - HIP CLOCKS  - 1 x daily - 7 x weekly - 1 sets - 10 reps - Single Leg Deadlift with Kettlebell  - 1 x daily - 7 x weekly - 1 sets - 10 reps - Side Plank with Clam  - 1 x daily - 7 x weekly - 1 sets - 5 reps - Side Plank on Knees  - 1 x daily - 7 x weekly - 1 sets - 5 reps - Bird Dog  - 1 x daily - 7 x weekly - 1 sets - 10 reps - Goblet Squat with Kettlebell  - 1 x daily - 7 x weekly - 2 sets - 5 reps - Single-Leg [place] Deadlift With Kettlebell  - 1 x daily - 7 x weekly - 1 sets - 10 reps - Runner's Climb  - 1 x daily - 7 x weekly - 1 sets - 10 reps - 3 hold - Side Stepping with Resistance at Thighs  - 1 x daily - 7 x weekly - 3 sets - 10 reps - Backward Monster Walk with Resistance (BKA)  - 1 x daily - 7 x weekly - 3 sets - 10 reps - Forward Monster Walk with Resistance (BKA)  - 1 x daily - 7 x weekly - 3 sets - 10 reps - Single Leg Lunge with Foot on Bench  - 1 x daily - 7 x weekly - 1 sets - 10 reps - Lateral Lunge  - 1 x daily - 7 x weekly - 1 sets - 10 reps - Modified Thomas Stretch  - 1 x daily - 7 x weekly - 2 sets - 2 reps - 30 hold - Supine Lower Trunk Rotation  - 1 x daily - 7 x weekly - 2 sets - 10 reps - Supine Bridge  - 1 x daily - 7 x weekly - 2 sets - 10 reps - Clamshell with Resistance  - 1 x daily - 7 x weekly - 2 sets - 10 reps - Supine Dead Bug with Leg Extension  - 1 x daily - 7 x weekly - 2 sets - 10 reps - Abdominal  Bracing  - 1 x daily - 7 x weekly - 2 sets - 10 reps - Side Lunge Adductor Stretch  - 1 x daily - 7 x weekly - 2 sets - 2 reps - 30 hold - Standing Hip Flexor Stretch  - 1 x daily - 7 x weekly - 2 sets - 2 reps - 30 hold - Latissimus Dorsi Stretch at Wall  - 1 x daily - 7 x weekly - 2 sets - 2 reps - 30 hold - Pigeon Pose  - 2 x daily - 7 x weekly - 1 sets - 2 reps - 30 hold - Hooklying Isometric Hip Flexion  - 1 x daily - 7 x weekly - 1 sets - 5 reps - 5 hold - Figure 4 Bridge  - 1 x daily - 7 x weekly - 2 sets - 5 reps - Beginner Front Arm Support  - 1 x daily - 7 x weekly - 2 sets - 5 reps - 3 hold - Single Leg Balance with Opposite Leg Star Reach  - 1 x daily - 7 x weekly - 2  sets - 5 reps - Sidelying Hip Abduction  - 1 x daily - 7 x weekly - 1 sets - 10 reps - Quadruped on Forearms Bent Knee Hip Extension  - 1 x daily - 7 x weekly - 1 sets - 10 reps - Sidelying Hip Abduction  - 1 x daily - 7 x weekly - 10 sets - 10 reps - Side Plank on Knees  - 1 x daily - 7 x weekly - 1 sets - 5 reps - 10 hold - Single Leg Bridge  - 1 x daily - 7 x weekly - 1 sets - 5-10 reps - 5 hold - Single Leg Heel Raise  - 1 x daily - 7 x weekly - 1 sets - 10 reps - Single Leg Squat with Chair Touch  - 1 x daily - 7 x weekly - 1 sets - 10 reps  ASSESSMENT:  CLINICAL IMPRESSION:  Improving LE strength with patient now able to do a single leg sit to stand from a 24 inch seat height.  Able to steadily progress challenge level of ex's.  Treatment session included change of speed of movement, both slowing down and speeding up, to challenge muscle endurance and coordination.  Able to tolerate increased speed of movement on the mini trampoline without pain production.   OBJECTIVE IMPAIRMENTS: decreased activity tolerance, decreased strength, increased fascial restrictions, impaired perceived functional ability, and pain.   ACTIVITY LIMITATIONS: squatting and locomotion level  PARTICIPATION LIMITATIONS: community  activity and recreation  PERSONAL FACTORS: Time since onset of injury/illness/exacerbation are also affecting patient's functional outcome.   REHAB POTENTIAL: Good  CLINICAL DECISION MAKING: Stable/uncomplicated  EVALUATION COMPLEXITY: Low   GOALS: Goals reviewed with patient? Yes  SHORT TERM GOALS: Target date: 11/13/2023    The patient will demonstrate knowledge of basic self care strategies and exercises to promote healing  Baseline: Goal status: met 2/25 2.  The patient will have improved hip strength to at least 4/5 needed for standing, walking longer distances  Baseline:  Goal status: met 2/25  3.  Able to single leg stand on right 10 sec without pelvic drop Baseline:  Goal status: met 2/25      LONG TERM GOALS: Target date:04/24/2024        The patient will be independent in a safe self progression of a home exercise program to promote further recovery of function  Baseline:  Goal status: ongoing  2.  The patient will have improved hip strength to at least 4+/5 needed for standing, walking longer distances Baseline:  Goal status: ongoing  3.  Able to do a full squat without compensatory shifting Baseline:  Goal status: ongoing  4.  Able to walk 3 miles for exercise/recreation Baseline:  Goal status: ongoing 5.  The patient will have improved FOTO score to   76%    indicating improved function with less pain  Baseline:  Goal status: ongoing  6.  PSFS for walking 2 miles improved to 7  7.  PSFS for running to play frisbee improved to 3  8. PSFS for lifting heavier objects improved to 7    PLAN:  PT FREQUENCY: weekly PT DURATION: 12 weeks  PLANNED INTERVENTIONS: 97164- PT Re-evaluation, 97110-Therapeutic exercises, 97530- Therapeutic activity, 97112- Neuromuscular re-education, 97535- Self Care, [id number]- Manual therapy, [id number]- Aquatic Therapy, 97014- Electrical stimulation (unattended), [id number]- Electrical stimulation (manual), [id number]- Ultrasound,  [id number]- Ionotophoresis 4mg /ml Dexamethasone, Taping, Dry Needling, Joint mobilization, Moist heat, and Biofeedback  PLAN FOR NEXT SESSION: check remaining  goals at the end of the month to determine readiness for discharge to an independent HEP;  lower quarter strengthening particularly glutes, quads, foot intrinsics  Glade Pesa, PT 04/08/24 5:32 PM Phone: [phone] Fax: [phone]

## 2024-04-16 ENCOUNTER — Encounter: Payer: Self-pay | Admitting: Physical Therapy

## 2024-04-16 ENCOUNTER — Ambulatory Visit: Admitting: Physical Therapy

## 2024-04-16 DIAGNOSIS — R252 Cramp and spasm: Secondary | ICD-10-CM

## 2024-04-16 DIAGNOSIS — M25651 Stiffness of right hip, not elsewhere classified: Secondary | ICD-10-CM

## 2024-04-16 DIAGNOSIS — M25551 Pain in right hip: Secondary | ICD-10-CM | POA: Diagnosis not present

## 2024-04-16 DIAGNOSIS — M6281 Muscle weakness (generalized): Secondary | ICD-10-CM

## 2024-04-16 DIAGNOSIS — R293 Abnormal posture: Secondary | ICD-10-CM | POA: Diagnosis not present

## 2024-04-16 NOTE — Therapy (Signed)
 OUTPATIENT PHYSICAL THERAPY LOWER EXTREMITY PROGRESS NOTE   Patient Name: Stephanie Walton MRN: [id number] DOB:[birth year], 53 y.o., female Today's Date: 04/16/2024  END OF SESSION:  PT End of Session - 04/16/24 2020     Visit Number 15    Date for PT Re-Evaluation 04/24/24    Authorization Type [employer]    PT Start Time 0930    PT Stop Time 1015    PT Time Calculation (min) 45 min    Activity Tolerance Patient tolerated treatment well    Behavior During Therapy [facility] for tasks assessed/performed              History reviewed. No pertinent past medical history. History reviewed. No pertinent surgical history. 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  Cycling [place] and [place] October  EVAL:New imaging shows Anterior superior right labral hip tear.  Symptoms present for years.   2 miles walking is the limit at one time.  Have to be careful with swing dancing.  Have appt with surgeon [name] on the 23rd.   I joined a Engineer, site 30 min TM walking, leg press (feels good),hip abductor/adductor 55#, lats, row; PVC trunk rotation; leg swings;  I haven't done my other home ex's in a while but going to start back Standing abdominal leg lifts will produce popping right hip   PERTINENT HISTORY: History of Right ankle peroneal tendonitis History of disc bulge PAIN:   Are you having pain? no NPRS scale:  0/10 Pain location: right buttock lateral Pain orientation: Right  Pain description: intermittent  Aggravating factors: walking 2 miles, popping  with certain movements Relieving factors: some of the exercises  PRECAUTIONS: None    WEIGHT BEARING RESTRICTIONS: No  FALLS:  Has patient fallen in last 6 months? No LIVING ENVIRONMENT: Lives with: lives with their spouse Lives in: House/apartment Stairs: Yes: Internal: 16 steps; on right going up and External: 3 steps; on right going up Has following equipment at home: None  OCCUPATION: full time, international travel, standing desk and lab work   PLOF: Independent  PATIENT GOALS: determine if surgery is needed or if PT can manage this problem  NEXT MD VISIT: consult with surgeon [name] later this month  OBJECTIVE:  Note: Objective measures were completed at Evaluation unless otherwise noted.  DIAGNOSTIC FINDINGS: 08/29/23: MR OF THE RIGHT HIP WITHOUT CONTRAST   TECHNIQUE: Multiplanar, multisequence MR imaging was performed. No intravenous contrast was administered.   COMPARISON:  None Available.   FINDINGS: Bones: Unremarkable   Articular cartilage and labrum   Articular cartilage:  Unremarkable   Labrum:  Torn anterior superior labrum, image 17 series 13.   Joint or bursal effusion   Joint effusion:  Absent   Bursae: No regional bursitis.   Muscles and tendons   Muscles and tendons: Mild band of edema anterolaterally in the gluteus medius muscle, images 5 through 12 series 11, potentially from muscle strain.   Other findings   Miscellaneous:   No sciatic notch impingement.   IMPRESSION: 1. Torn right acetabular anterior superior labrum.  2. Mild band of edema anterolaterally in the gluteus medius muscle, potentially from muscle strain.      PATIENT SURVEYS:  FOTO 67% 3/4: 73% 5/8: 65%  COGNITION: Overall cognitive status: Within functional limits for tasks assessed     PALPATION: Rt  glut min, glut med, piriformis tender to palpation  LOWER EXTREMITY ROM:  full and symmetrical hip ROM although pt is fearful with passive hip internal  and external rotation; no pain with full knee to chest hip flexion;  decreased right hip flexor length noted in sidelying  LOWER EXTREMITY MMT:  right hip abduction 4/5;  right hip flexion and extension 4+/5  TRUNK STRENGTH:  Decreased activation of transverse abdominus muscles; abdominals 4/5; decreased activation of lumbar multifidi; trunk extensors 4/5 Decreased stability noted with bird dogs (WB on right knee)   LOWER EXTREMITY SPECIAL TESTS:  Immediate Pelvic drop with SLS on right   FUNCTIONAL TESTS:  Able to do a full squat however shifts toward the left on descent and rise  GAIT: Comments: WNLs for clinic ambulation The Patient-Specific Functional Scale  Initial:  I am going to ask you to identify up to 3 important activities that you are unable to do or are having difficulty with as a result of this problem.  Today are there any activities that you are unable to do or having difficulty with because of this?  (Patient shown scale and patient rated each activity)  Follow up: When you first came in you had difficulty performing these activities.  Today do you still have difficulty?  Patient-Specific activity scoring scheme (Point to one number):  0 1 2 3 4 5 6 7 8 9  10 Unable                                                                                                          Able to perform To perform                                                                                                    activity at the same Activity         Level as before  Injury or problem  Activity           Walking 2 miles                                                                      Initial:       5-6                  2.              running a little for playing frisbee or jogging                    Initial:             0           3.              Be able to lift  heavier things/furniture                                  Initial:      5-6                     TREATMENT DATE:  04/16/24:  04/02/24:Pt arrives for aquatic physical therapy. Treatment took place in 3.5-5.5 feet of water. Water temperature was 91 degrees F. Pt entered the pool via stairs independently. Pt requires buoyancy of water for support and to offload joints with strengthening exercises.  Pt utilizes viscosity of the water required for strengthening. 75% depth water walking with UE push/pull using single buoy wts.10x in each direction with some lengths being more of a jog/bounding.. Hip 3 ways with ankle fins 30x in each direction no UE required. Standing core contractions with pink water bells 2x20 push forward and back.8 min underwater bicycle with ankle fins on to increase load for push/pull on horseback.  04/08/24  Bike 10 min L2 while during status update Facing wall mountain climbers slow and fast speed 10x Lateral to the wall mountain climbers 10x right/left  Single leg STS from 24 inch 10x  SLS in front of mirror with active arch lifts while also engaging glutes right/left Resisted backward walking  with belt 15# cable pulley with emphasis on push off/toe lift 10x; forward walking 15# 10x (very challenging) Mini trampoline 1 minute each: lateral shifting quick tempo, light jogging, staggered stance, bouncing place   04/02/24:Pt arrives for aquatic physical therapy. Treatment took place in 3.5-5.5 feet of water. Water temperature was 91 degrees F. Pt entered the pool via stairs independently. Pt requires buoyancy of water for support and to offload joints with strengthening exercises.  Pt utilizes viscosity of the water required for strengthening. 75% depth water walking with UE push/pull using single buoy wts.10x in each direction with some lengths being more of a jog/bounding with ankle fins on. Hip 3 ways with ankle fins 30x in each direction. Standing core contractions with pink water  bells 2x20 push forward and back. 10  min underwater bicycle with ankle fins on to increase load for push/pull on horseback.   PATIENT EDUCATION:  Education details: Educated patient on anatomy and physiology of current symptoms, prognosis, plan of  care as well as initial self care strategies to promote recovery Person educated: Patient Education method: Explanation Education comprehension: verbalized understanding  HOME EXERCISE PROGRAM: Access Code: [email or web address] URL: [email or web address] Date: 01/31/2024 Prepared by: Glade Pesa  Exercises - HIP CLOCKS  - 1 x daily - 7 x weekly - 1 sets - 10 reps - Single Leg Deadlift with Kettlebell  - 1 x daily - 7 x weekly - 1 sets - 10 reps - Side Plank with Clam  - 1 x daily - 7 x weekly - 1 sets - 5 reps - Side Plank on Knees  - 1 x daily - 7 x weekly - 1 sets - 5 reps - Bird Dog  - 1 x daily - 7 x weekly - 1 sets - 10 reps - Goblet Squat with Kettlebell  - 1 x daily - 7 x weekly - 2 sets - 5 reps - Single-Leg [place] Deadlift With Kettlebell  - 1 x daily - 7 x weekly - 1 sets - 10 reps - Runner's Climb  - 1 x daily - 7 x weekly - 1 sets - 10 reps - 3 hold - Side Stepping with Resistance at Thighs  - 1 x daily - 7 x weekly - 3 sets - 10 reps - Backward Monster Walk with Resistance (BKA)  - 1 x daily - 7 x weekly - 3 sets - 10 reps - Forward Monster Walk with Resistance (BKA)  - 1 x daily - 7 x weekly - 3 sets - 10 reps - Single Leg Lunge with Foot on Bench  - 1 x daily - 7 x weekly - 1 sets - 10 reps - Lateral Lunge  - 1 x daily - 7 x weekly - 1 sets - 10 reps - Modified Thomas Stretch  - 1 x daily - 7 x weekly - 2 sets - 2 reps - 30 hold - Supine Lower Trunk Rotation  - 1 x daily - 7 x weekly - 2 sets - 10 reps - Supine Bridge  - 1 x daily - 7 x weekly - 2 sets - 10 reps - Clamshell with Resistance  - 1 x daily - 7 x weekly - 2 sets - 10 reps - Supine Dead Bug with Leg Extension  - 1 x daily - 7 x weekly - 2 sets - 10 reps -  Abdominal Bracing  - 1 x daily - 7 x weekly - 2 sets - 10 reps - Side Lunge Adductor Stretch  - 1 x daily - 7 x weekly - 2 sets - 2 reps - 30 hold - Standing Hip Flexor Stretch  - 1 x daily - 7 x weekly - 2 sets - 2 reps - 30 hold - Latissimus Dorsi Stretch at Wall  - 1 x daily - 7 x weekly - 2 sets - 2 reps - 30 hold - Pigeon Pose  - 2 x daily - 7 x weekly - 1 sets - 2 reps - 30 hold - Hooklying Isometric Hip Flexion  - 1 x daily - 7 x weekly - 1 sets - 5 reps - 5 hold - Figure 4 Bridge  - 1 x daily - 7 x weekly - 2 sets - 5 reps - Beginner Front Arm Support  - 1 x daily - 7 x weekly - 2 sets - 5 reps - 3 hold - Single Leg Balance with Opposite Leg Star Reach  - 1 x daily - 7 x weekly - 2  sets - 5 reps - Sidelying Hip Abduction  - 1 x daily - 7 x weekly - 1 sets - 10 reps - Quadruped on Forearms Bent Knee Hip Extension  - 1 x daily - 7 x weekly - 1 sets - 10 reps - Sidelying Hip Abduction  - 1 x daily - 7 x weekly - 10 sets - 10 reps - Side Plank on Knees  - 1 x daily - 7 x weekly - 1 sets - 5 reps - 10 hold - Single Leg Bridge  - 1 x daily - 7 x weekly - 1 sets - 5-10 reps - 5 hold - Single Leg Heel Raise  - 1 x daily - 7 x weekly - 1 sets - 10 reps - Single Leg Squat with Chair Touch  - 1 x daily - 7 x weekly - 1 sets - 10 reps  ASSESSMENT:  CLINICAL IMPRESSION: Pt independent in her aquatic program.    OBJECTIVE IMPAIRMENTS: decreased activity tolerance, decreased strength, increased fascial restrictions, impaired perceived functional ability, and pain.   ACTIVITY LIMITATIONS: squatting and locomotion level  PARTICIPATION LIMITATIONS: community activity and recreation  PERSONAL FACTORS: Time since onset of injury/illness/exacerbation are also affecting patient's functional outcome.   REHAB POTENTIAL: Good  CLINICAL DECISION MAKING: Stable/uncomplicated  EVALUATION COMPLEXITY: Low   GOALS: Goals reviewed with patient? Yes  SHORT TERM GOALS: Target date: 11/13/2023    The  patient will demonstrate knowledge of basic self care strategies and exercises to promote healing  Baseline: Goal status: met 2/25 2.  The patient will have improved hip strength to at least 4/5 needed for standing, walking longer distances  Baseline:  Goal status: met 2/25  3.  Able to single leg stand on right 10 sec without pelvic drop Baseline:  Goal status: met 2/25      LONG TERM GOALS: Target date:04/24/2024        The patient will be independent in a safe self progression of a home exercise program to promote further recovery of function  Baseline:  Goal status: ongoing  2.  The patient will have improved hip strength to at least 4+/5 needed for standing, walking longer distances Baseline:  Goal status: ongoing  3.  Able to do a full squat without compensatory shifting Baseline:  Goal status: ongoing  4.  Able to walk 3 miles for exercise/recreation Baseline:  Goal status: ongoing 5.  The patient will have improved FOTO score to   76%    indicating improved function with less pain  Baseline:  Goal status: ongoing  6.  PSFS for walking 2 miles improved to 7  7.  PSFS for running to play frisbee improved to 3  8. PSFS for lifting heavier objects improved to 7    PLAN:  PT FREQUENCY: weekly PT DURATION: 12 weeks  PLANNED INTERVENTIONS: 97164- PT Re-evaluation, 97110-Therapeutic exercises, 97530- Therapeutic activity, 97112- Neuromuscular re-education, 97535- Self Care, [id number]- Manual therapy, [id number]- Aquatic Therapy, 97014- Electrical stimulation (unattended), [id number]- Electrical stimulation (manual), [id number]- Ultrasound, [id number]- Ionotophoresis 4mg /ml Dexamethasone, Taping, Dry Needling, Joint mobilization, Moist heat, and Biofeedback  PLAN FOR NEXT SESSION: Probable discharge from [employer], PTA 04/16/24 8:21 PM

## 2024-04-24 ENCOUNTER — Ambulatory Visit: Admitting: Physical Therapy

## 2024-04-24 DIAGNOSIS — M6281 Muscle weakness (generalized): Secondary | ICD-10-CM

## 2024-04-24 DIAGNOSIS — R293 Abnormal posture: Secondary | ICD-10-CM | POA: Diagnosis not present

## 2024-04-24 DIAGNOSIS — M25651 Stiffness of right hip, not elsewhere classified: Secondary | ICD-10-CM

## 2024-04-24 DIAGNOSIS — R252 Cramp and spasm: Secondary | ICD-10-CM | POA: Diagnosis not present

## 2024-04-24 DIAGNOSIS — M25551 Pain in right hip: Secondary | ICD-10-CM

## 2024-04-24 NOTE — Therapy (Signed)
 OUTPATIENT PHYSICAL THERAPY LOWER EXTREMITY PROGRESS NOTE/DISCHARGE SUMMARY   Patient Name: Stephanie Walton MRN: [id number] DOB:1971/07/01, 53 y.o., female Today's Date: 04/24/2024  END OF SESSION:  PT End of Session - 04/24/24 0800     Visit Number 16    Date for PT Re-Evaluation 04/24/24    Authorization Type [employer]    PT Start Time 0800    PT Stop Time 0840    PT Time Calculation (min) 40 min    Activity Tolerance Patient tolerated treatment well              No past medical history on file. No past surgical history on file. 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  Did a little over 2 miles but couldn't go on.  I was disappointed that I couldn't do 3 miles. I feel like I'm making progress but it didn't translate to walking.      Cycling [place] and [place] October  EVAL:New imaging shows Anterior superior right labral hip tear.  Symptoms present for years.   2 miles walking is the limit at one time.  Have to be careful with swing dancing.  Have appt with surgeon [name] on the 23rd.   I joined a Engineer, site 30 min TM walking, leg press (feels good),hip abductor/adductor 55#, lats, row; PVC trunk rotation; leg swings;  I haven't done my other home ex's in a while but going to start back Standing abdominal leg lifts will produce popping right hip   PERTINENT HISTORY: History of Right ankle peroneal tendonitis History of disc bulge PAIN:   Are you having pain? no NPRS scale:  0/10 Pain location: right buttock  lateral Pain orientation: Right  Pain description: intermittent  Aggravating factors: walking 2 miles, popping with certain movements Relieving factors: some of the exercises  PRECAUTIONS: None    WEIGHT BEARING RESTRICTIONS: No  FALLS:  Has patient fallen in last 6 months? No LIVING ENVIRONMENT: Lives with: lives with their spouse Lives in: House/apartment Stairs: Yes: Internal: 16 steps; on right going up and External: 3 steps; on right going up Has following equipment at home: None  OCCUPATION: full time, international travel, standing desk and lab work   PLOF: Independent  PATIENT GOALS: determine if surgery is needed or if PT can manage this problem  NEXT MD VISIT: consult with surgeon [name] later this month  OBJECTIVE:  Note: Objective measures were completed at Evaluation unless otherwise noted.  DIAGNOSTIC FINDINGS: 08/29/23: MR OF THE RIGHT HIP WITHOUT CONTRAST   TECHNIQUE: Multiplanar, multisequence MR imaging was performed. No intravenous contrast was administered.   COMPARISON:  None Available.   FINDINGS: Bones: Unremarkable   Articular cartilage and labrum   Articular cartilage:  Unremarkable   Labrum:  Torn anterior superior labrum, image 17 series 13.   Joint or bursal effusion   Joint effusion:  Absent   Bursae: No regional bursitis.   Muscles and tendons   Muscles and tendons: Mild band of edema anterolaterally in the gluteus medius muscle, images 5  through 12 series 11, potentially from muscle strain.   Other findings   Miscellaneous:   No sciatic notch impingement.   IMPRESSION: 1. Torn right acetabular anterior superior labrum. 2. Mild band of edema anterolaterally in the gluteus medius muscle, potentially from muscle strain.      PATIENT SURVEYS:  FOTO 67% 3/4: 73% 5/8: 65% 7/31:  COGNITION: Overall cognitive status: Within functional limits for tasks assessed     PALPATION: Rt  glut min, glut med, piriformis  tender to palpation  LOWER EXTREMITY ROM:  full and symmetrical hip ROM although pt is fearful with passive hip internal and external rotation; no pain with full knee to chest hip flexion;  decreased right hip flexor length noted in sidelying  LOWER EXTREMITY MMT:  right hip abduction 4/5;  right hip flexion and extension 4+/5 7/31: right hip abduction 4+/5, hip flexion and extension 5/5  TRUNK STRENGTH:  Decreased activation of transverse abdominus muscles; abdominals 4/5; decreased activation of lumbar multifidi; trunk extensors 4/5 Decreased stability noted with bird dogs (WB on right knee)   LOWER EXTREMITY SPECIAL TESTS:  Immediate Pelvic drop with SLS on right  7/31: much improved do pelvic drop with focused attention  FUNCTIONAL TESTS:  Able to do a full squat however shifts toward the left on descent and rise 7/31: full symmetrical squat   GAIT: Comments: WNLs for clinic ambulation The Patient-Specific Functional Scale  Initial:  I am going to ask you to identify up to 3 important activities that you are unable to do or are having difficulty with as a result of this problem.  Today are there any activities that you are unable to do or having difficulty with because of this?  (Patient shown scale and patient rated each activity)  Follow up: When you first came in you had difficulty performing these activities.  Today do you still have difficulty?  Patient-Specific activity scoring scheme (Point to one number):  0 1 2 3 4 5 6 7 8 9  10 Unable                                                                                                          Able to perform To perform                                                                                                    activity at the same Activity         Level as before  Injury or problem  Activity            Walking 2 miles                                                                      Initial:       5-6                7/31: 3-4  2.              running a little for playing frisbee or jogging                    Initial:             0            7/31:7-8  3.              Be able to lift heavier things/furniture                                  Initial:      5-6              7/31:   8        TREATMENT DATE: 04/24/24  Bike 10 min L2 while during status update FOTO PSFS Discussion of plan for trainer at the gym Single leg STS from 24 inch 5x  SLS  Resisted backward walking  with belt 15# cable pulley with emphasis on push off/toe lift 10x; forward walking 15# 10x    04/02/24:Pt arrives for aquatic physical therapy. Treatment took place in 3.5-5.5 feet of water. Water temperature was 91 degrees F. Pt entered the pool via stairs independently. Pt requires buoyancy of water for support and to offload joints with strengthening exercises.  Pt utilizes viscosity of the water required for strengthening. 75% depth water walking with UE push/pull using single buoy wts.10x in each direction with some lengths being more of a jog/bounding.. Hip 3 ways with ankle fins 30x in each direction no UE required. Standing core contractions with pink water bells 2x20 push forward and back.8 min underwater bicycle with ankle fins on to increase load for push/pull on horseback.  04/08/24  Bike 10 min L2 while during status update Facing wall mountain climbers slow and fast speed 10x Lateral to the wall mountain climbers 10x right/left  Single leg STS from 24 inch 10x  SLS in front of mirror with active arch lifts while also engaging glutes right/left Resisted backward walking  with belt 15# cable pulley with emphasis on push off/toe lift 10x; forward walking 15# 10x (very challenging) Mini trampoline 1 minute each: lateral shifting quick tempo, light jogging, staggered stance, bouncing place   04/02/24:Pt arrives for  aquatic physical therapy. Treatment took place in 3.5-5.5 feet of water. Water temperature was 91 degrees F. Pt entered the pool via stairs independently. Pt requires buoyancy of water for support and to offload joints with strengthening exercises.  Pt utilizes viscosity of the water required for strengthening. 75% depth water walking with UE push/pull using single buoy wts.10x in each direction with some lengths being more of a jog/bounding with ankle fins  on. Hip 3 ways with ankle fins 30x in each direction. Standing core contractions with pink water bells 2x20 push forward and back. 10  min underwater bicycle with ankle fins on to increase load for push/pull on horseback.   PATIENT EDUCATION:  Education details: Educated patient on anatomy and physiology of current symptoms, prognosis, plan of care as well as initial self care strategies to promote recovery Person educated: Patient Education method: Explanation Education comprehension: verbalized understanding  HOME EXERCISE PROGRAM: Access Code: [email or web address] URL: [email or web address] Date: 01/31/2024 Prepared by: Glade Pesa  Exercises - HIP CLOCKS  - 1 x daily - 7 x weekly - 1 sets - 10 reps - Single Leg Deadlift with Kettlebell  - 1 x daily - 7 x weekly - 1 sets - 10 reps - Side Plank with Clam  - 1 x daily - 7 x weekly - 1 sets - 5 reps - Side Plank on Knees  - 1 x daily - 7 x weekly - 1 sets - 5 reps - Bird Dog  - 1 x daily - 7 x weekly - 1 sets - 10 reps - Goblet Squat with Kettlebell  - 1 x daily - 7 x weekly - 2 sets - 5 reps - Single-Leg [place] Deadlift With Kettlebell  - 1 x daily - 7 x weekly - 1 sets - 10 reps - Runner's Climb  - 1 x daily - 7 x weekly - 1 sets - 10 reps - 3 hold - Side Stepping with Resistance at Thighs  - 1 x daily - 7 x weekly - 3 sets - 10 reps - Backward Monster Walk with Resistance (BKA)  - 1 x daily - 7 x weekly - 3 sets - 10 reps - Forward Monster Walk with Resistance (BKA)  - 1 x daily  - 7 x weekly - 3 sets - 10 reps - Single Leg Lunge with Foot on Bench  - 1 x daily - 7 x weekly - 1 sets - 10 reps - Lateral Lunge  - 1 x daily - 7 x weekly - 1 sets - 10 reps - Modified Thomas Stretch  - 1 x daily - 7 x weekly - 2 sets - 2 reps - 30 hold - Supine Lower Trunk Rotation  - 1 x daily - 7 x weekly - 2 sets - 10 reps - Supine Bridge  - 1 x daily - 7 x weekly - 2 sets - 10 reps - Clamshell with Resistance  - 1 x daily - 7 x weekly - 2 sets - 10 reps - Supine Dead Bug with Leg Extension  - 1 x daily - 7 x weekly - 2 sets - 10 reps - Abdominal Bracing  - 1 x daily - 7 x weekly - 2 sets - 10 reps - Side Lunge Adductor Stretch  - 1 x daily - 7 x weekly - 2 sets - 2 reps - 30 hold - Standing Hip Flexor Stretch  - 1 x daily - 7 x weekly - 2 sets - 2 reps - 30 hold - Latissimus Dorsi Stretch at Wall  - 1 x daily - 7 x weekly - 2 sets - 2 reps - 30 hold - Pigeon Pose  - 2 x daily - 7 x weekly - 1 sets - 2 reps - 30 hold - Hooklying Isometric Hip Flexion  - 1 x daily - 7 x weekly - 1 sets - 5 reps - 5 hold - Figure 4 Bridge  -  1 x daily - 7 x weekly - 2 sets - 5 reps - Beginner Front Arm Support  - 1 x daily - 7 x weekly - 2 sets - 5 reps - 3 hold - Single Leg Balance with Opposite Leg Star Reach  - 1 x daily - 7 x weekly - 2 sets - 5 reps - Sidelying Hip Abduction  - 1 x daily - 7 x weekly - 1 sets - 10 reps - Quadruped on Forearms Bent Knee Hip Extension  - 1 x daily - 7 x weekly - 1 sets - 10 reps - Sidelying Hip Abduction  - 1 x daily - 7 x weekly - 10 sets - 10 reps - Side Plank on Knees  - 1 x daily - 7 x weekly - 1 sets - 5 reps - 10 hold - Single Leg Bridge  - 1 x daily - 7 x weekly - 1 sets - 5-10 reps - 5 hold - Single Leg Heel Raise  - 1 x daily - 7 x weekly - 1 sets - 10 reps - Single Leg Squat with Chair Touch  - 1 x daily - 7 x weekly - 1 sets - 10 reps  ASSESSMENT:  CLINICAL IMPRESSION:  The patient has met the majority of rehab goals, with noted improvements in pain  reduction, outcome score, ROM, strength and functional mobility.  A comprehensive HEP has been established and anticipate further improvements over time with regular performance of the program.  Recommend discharge from PT at this time.    OBJECTIVE IMPAIRMENTS: decreased activity tolerance, decreased strength, increased fascial restrictions, impaired perceived functional ability, and pain.   ACTIVITY LIMITATIONS: squatting and locomotion level  PARTICIPATION LIMITATIONS: community activity and recreation  PERSONAL FACTORS: Time since onset of injury/illness/exacerbation are also affecting patient's functional outcome.   REHAB POTENTIAL: Good  CLINICAL DECISION MAKING: Stable/uncomplicated  EVALUATION COMPLEXITY: Low   GOALS: Goals reviewed with patient? Yes  SHORT TERM GOALS: Target date: 11/13/2023    The patient will demonstrate knowledge of basic self care strategies and exercises to promote healing  Baseline: Goal status: met 2/25 2.  The patient will have improved hip strength to at least 4/5 needed for standing, walking longer distances  Baseline:  Goal status: met 2/25  3.  Able to single leg stand on right 10 sec without pelvic drop Baseline:  Goal status: met 2/25      LONG TERM GOALS: Target date:04/24/2024        The patient will be independent in a safe self progression of a home exercise program to promote further recovery of function  Baseline:  Goal status: met 7/31  2.  The patient will have improved hip strength to at least 4+/5 needed for standing, walking longer distances Baseline:  Goal status: met 7/31  3.  Able to do a full squat without compensatory shifting Baseline:  Goal status: ongoing  4.  Able to walk 3 miles for exercise/recreation Baseline:  Goal status: not met 5.  The patient will have improved FOTO score to   76%    indicating improved function with less pain  Baseline:  Goal status: almost met 75%  6.  PSFS for walking  2 miles improved to 7  not met  7.  PSFS for running to play frisbee improved to 3   met  8. PSFS for lifting heavier objects improved to 7   met    PLAN: PHYSICAL THERAPY DISCHARGE SUMMARY  Visits from  Start of Care: 16  Current functional level related to goals / functional outcomes: As above   Remaining deficits: As above   Education / Equipment: Comprehensive HEP   Patient agrees to discharge. Patient goals were partially met. Patient is being discharged due to meeting the stated rehab goals.  Glade Pesa, PT 04/24/24 10:13 PM Phone: [phone] Fax: [phone]

## 2024-04-30 ENCOUNTER — Ambulatory Visit: Admitting: Physical Therapy

## 2024-10-16 ENCOUNTER — Other Ambulatory Visit: Payer: Self-pay | Admitting: Family Medicine

## 2024-10-16 DIAGNOSIS — Z1231 Encounter for screening mammogram for malignant neoplasm of breast: Secondary | ICD-10-CM

## 2024-10-31 ENCOUNTER — Ambulatory Visit

## 2024-11-06 ENCOUNTER — Ambulatory Visit
